# Patient Record
Sex: Male | Born: 1985 | Race: Black or African American | Hispanic: No | Marital: Single | Smoking: Current every day smoker
Health system: Southern US, Community
[De-identification: ages and names within clinical notes are randomized; demographics above are authoritative.]

## PROBLEM LIST (undated history)

## (undated) DIAGNOSIS — J45909 Unspecified asthma, uncomplicated: Secondary | ICD-10-CM

## (undated) DIAGNOSIS — M109 Gout, unspecified: Secondary | ICD-10-CM

## (undated) HISTORY — DX: Unspecified asthma, uncomplicated: J45.909

## (undated) HISTORY — PX: NO PAST SURGERIES: SHX2092

---

## 2019-03-27 ENCOUNTER — Other Ambulatory Visit: Payer: Self-pay

## 2019-03-27 ENCOUNTER — Emergency Department (HOSPITAL_COMMUNITY)
Admission: EM | Admit: 2019-03-27 | Discharge: 2019-03-27 | Disposition: A | Discharge status: Home / Self Care | Payer: Self-pay | Attending: Emergency Medicine | Admitting: Emergency Medicine

## 2019-03-27 ENCOUNTER — Emergency Department (HOSPITAL_COMMUNITY): Payer: Self-pay

## 2019-03-27 ENCOUNTER — Encounter (HOSPITAL_COMMUNITY): Payer: Self-pay

## 2019-03-27 DIAGNOSIS — W109XXA Fall (on) (from) unspecified stairs and steps, initial encounter: Secondary | ICD-10-CM | POA: Insufficient documentation

## 2019-03-27 DIAGNOSIS — Y999 Unspecified external cause status: Secondary | ICD-10-CM | POA: Insufficient documentation

## 2019-03-27 DIAGNOSIS — M25572 Pain in left ankle and joints of left foot: Secondary | ICD-10-CM | POA: Insufficient documentation

## 2019-03-27 DIAGNOSIS — Y9301 Activity, walking, marching and hiking: Secondary | ICD-10-CM | POA: Insufficient documentation

## 2019-03-27 DIAGNOSIS — Y929 Unspecified place or not applicable: Secondary | ICD-10-CM | POA: Insufficient documentation

## 2019-03-27 MED ORDER — ACETAMINOPHEN 325 MG PO TABS
650.0000 mg | ORAL_TABLET | Freq: Once | ORAL | Status: AC | PRN
  Administered 2019-03-27: 650 mg via ORAL
  Filled 2019-03-27: qty 2

## 2019-03-27 NOTE — Discharge Instructions (Addendum)
Tylenol ibuprofen as needed for pain.  Do not take more than 4000 mg Tylenol or more than 2400 mg ibuprofen daily.  Make sure to ice and rest your ankle.  Follow-up with orthopedics if you are unable to walk on your foot after 3 days.

## 2019-03-27 NOTE — ED Provider Notes (Signed)
[facility] EMERGENCY DEPARTMENT Provider Note   CSN: [id number] Arrival date & time: 03/27/19  1723     History   Chief Complaint Chief Complaint  Patient presents with  . Ankle Pain    HPI Grant Ramirez is a 33 y.o. male with patient if past medical history who presents for evaluation left ankle pain.  Patient states he was walking down the stairs when he tripped and inverted his left ankle.  Patient denies hitting head, LOC or anticoagulation.  Patient states he has had some swelling to his left lateral ankle in the incident.  He has been able to walk however states he has pain with walking.  Denies headache, neck pain, back pain, paresthesias, decreased range of motion, redness, swelling, warmth to his extremities.  Denies additional aggravating or alleviating factors.  Rates his current pain a 6/10.  This was improved with Tylenol given by triage nurse.  History obtained from patient and past medical records.  No interpreter is used.     HPI  History reviewed. No pertinent past medical history.  There are no active problems to display for this patient.   History reviewed. No pertinent surgical history.      Home Medications    Prior to Admission medications   Not on File    Family History No family history on file.  Social History Social History   Tobacco Use  . Smoking status: Not on file  Substance Use Topics  . Alcohol use: Not on file  . Drug use: Not on file     Allergies   Patient has no allergy information on record.   Review of Systems Review of Systems  Constitutional: Negative.   HENT: Negative.   Respiratory: Negative.   Cardiovascular: Negative.   Gastrointestinal: Negative.   Genitourinary: Negative.   Musculoskeletal: Positive for gait problem.       Left ankle pain. Limp with ambulation  Skin: Negative.   All other systems reviewed and are negative.    Physical Exam Updated Vital Signs BP (!) 143/88 (BP  Location: Left Arm)   Pulse 98   Temp 98.2 F (36.8 C) (Oral)   Resp 16   Ht 6\' 1"  (1.854 m)   SpO2 99%   Physical Exam Vitals signs and nursing note reviewed.  Constitutional:      General: He is not in acute distress.    Appearance: He is well-developed. He is not ill-appearing, toxic-appearing or diaphoretic.  HENT:     Head: Normocephalic and atraumatic.     Nose: Nose normal.     Mouth/Throat:     Mouth: Mucous membranes are moist.  Eyes:     Pupils: Pupils are equal, round, and reactive to light.  Neck:     Musculoskeletal: Normal range of motion and neck supple.  Cardiovascular:     Rate and Rhythm: Normal rate and regular rhythm.     Pulses: Normal pulses.          Dorsalis pedis pulses are 2+ on the right side and 2+ on the left side.       Posterior tibial pulses are 2+ on the right side and 2+ on the left side.     Heart sounds: Normal heart sounds.  Pulmonary:     Effort: Pulmonary effort is normal. No respiratory distress.  Abdominal:     General: Bowel sounds are normal. There is no distension.     Palpations: Abdomen is soft.  Musculoskeletal: Normal  range of motion.     Left knee: Normal.     Right ankle: Normal.     Left ankle: He exhibits swelling. He exhibits normal range of motion, no ecchymosis, no deformity, no laceration and normal pulse. Tenderness. Lateral malleolus tenderness found. No medial malleolus, no AITFL, no CF ligament, no posterior TFL, no head of 5th metatarsal and no proximal fibula tenderness found.     Left lower leg: Normal.     Right foot: Normal.     Left foot: Normal.       Feet:     Comments: Tenderness palpation to left lateral malleolus.  He has full range of motion without difficulty.  He has no bony tenderness to foot, midshaft or proximal tibia or fibula.  No tenderness to foot.  He is ambulatory with limp. Compartments soft. No tenderness to calf.  Feet:     Right foot:     Skin integrity: Skin integrity normal.     Left  foot:     Skin integrity: Skin integrity normal.  Skin:    General: Skin is warm and dry.     Capillary Refill: Capillary refill takes less than 2 seconds.     Comments: Mild soft tissue swelling to left lateral malleolus.  No ecchymosis, erythema, warmth.  Brisk capillary refill.  Neurological:     Mental Status: He is alert.     Comments: Intact sensation.  Ambulatory with limp to left ankle.    ED Treatments / Results  Labs (all labs ordered are listed, but only abnormal results are displayed) Labs Reviewed - No data to display  EKG None  Radiology Dg Ankle Complete Left  Result Date: 03/27/2019 CLINICAL DATA:  Fall with lateral swelling. EXAM: LEFT ANKLE COMPLETE - 3+ VIEW COMPARISON:  None. FINDINGS: Moderate lateral malleolar soft tissue swelling. Age advanced degenerative changes about the medial compartment of the knee. Osseous fragment distally is favored to be related to remote trauma or an accessory ossicle. Base of fifth metatarsal and talar dome intact. Small calcaneal spur. IMPRESSION: Lateral soft tissue swelling, without adjacent fracture. Age advanced degenerative changes with likely nonacute ossific fragment about the medial malleolus. Correlate with point tenderness. Electronically Signed   By: Jeronimo GreavesKyle  Talbot M.D.   On: 03/27/2019 18:04    Procedures Procedures (including critical care time)  Medications Ordered in ED Medications  acetaminophen (TYLENOL) tablet 650 mg (650 mg Oral Given 03/27/19 1732)     Initial Impression / Assessment and Plan / ED Course  I have reviewed the triage vital signs and the nursing notes.  Pertinent labs & imaging results that were available during my care of the patient were reviewed by me and considered in my medical decision making (see chart for details).  33 year old male appears otherwise well presents for evaluation of left ankle pain after mechanical fall.  He is neurovascularly intact.  Neuromusculoskeletal exam however  he does have some mild soft tissue swelling through his left lateral malleolus.  He is ambulatory with a limp.  Plain film x-ray without evidence of fracture, dislocation or effusion.  Likely MSK sprain or strain.  Will place in Aircast, crutches, rice for symptomatic management.  Patient to follow-up with orthopedics if symptoms unresolved.  I have low suspicion for septic joint, gout, hemarthrosis, acute fracture, dislocation, DVT, compartment syndrome, acute bacterial infectious process.  The patient has been appropriately medically screened and/or stabilized in the ED. I have low suspicion for any other emergent medical condition which  would require further screening, evaluation or treatment in the ED or require inpatient management.        Final Clinical Impressions(s) / ED Diagnoses   Final diagnoses:  Acute left ankle pain    ED Discharge Orders    None       Marky Buresh A, PA-C 03/27/19 2022    Isla Pence, MD 03/27/19 2122

## 2019-03-27 NOTE — ED Notes (Signed)
Patient Alert and oriented to baseline. Stable and ambulatory to baseline. Patient verbalized understanding of the discharge instructions.  Patient belongings were taken by the patient.   

## 2019-03-27 NOTE — ED Triage Notes (Signed)
Pt reports left ankle pain after tripping down the stairs. Pt denies LOC or hitting his head, no other injuries. Pt ambulatory

## 2019-04-22 ENCOUNTER — Ambulatory Visit
Admission: EM | Admit: 2019-04-22 | Discharge: 2019-04-22 | Disposition: A | Discharge status: Home / Self Care | Payer: Self-pay | Attending: Physician Assistant | Admitting: Physician Assistant

## 2019-04-22 ENCOUNTER — Other Ambulatory Visit: Payer: Self-pay

## 2019-04-22 ENCOUNTER — Encounter: Payer: Self-pay | Admitting: Emergency Medicine

## 2019-04-22 DIAGNOSIS — L0291 Cutaneous abscess, unspecified: Secondary | ICD-10-CM

## 2019-04-22 DIAGNOSIS — L02219 Cutaneous abscess of trunk, unspecified: Secondary | ICD-10-CM

## 2019-04-22 MED ORDER — MUPIROCIN 2 % EX OINT: 1.0000 "application " | TOPICAL_OINTMENT | Freq: Two times a day (BID) | CUTANEOUS | 0 refills | Status: DC

## 2019-04-22 MED ORDER — DOXYCYCLINE HYCLATE 100 MG PO CAPS: 100.0000 mg | ORAL_CAPSULE | Freq: Two times a day (BID) | ORAL | 0 refills | Status: DC

## 2019-04-22 NOTE — ED Triage Notes (Signed)
Cyst on right side. Drained a little a few days ago. Been present for 1 week.

## 2019-04-22 NOTE — Discharge Instructions (Addendum)
Start doxycycline as directed. You can remove current dressing in 24 hours. Keep wound clean and dry. You can clean gently with soap and water. Do not soak area in water. Monitor for spreading redness, increased warmth, increased swelling, fever, follow up for reevaluation needed. °

## 2019-04-22 NOTE — ED Provider Notes (Signed)
[name]    CSN: [id number] Arrival date & time: 04/22/19  1347      History   Chief Complaint Chief Complaint  Patient presents with  . Abscess    HPI Grant Ramirez is a 33 y.o. male.   33 year old male comes in for possible abscess to the right flank x 1 week. Denies injury/trauma. Has been doing warm compress with some relief. Yesterday, noticed significant increase in size with increased pain and therefore came in for evaluation. Denies fever, chills, body aches. Had mild drainage today.      History reviewed. No pertinent past medical history.  There are no problems to display for this patient.   History reviewed. No pertinent surgical history.     Home Medications    Prior to Admission medications   Medication Sig Start Date End Date Taking? Authorizing Provider  doxycycline (VIBRAMYCIN) 100 MG capsule Take 1 capsule (100 mg total) by mouth 2 (two) times daily. 04/22/19   Tasia Catchings, Amy V, PA-C  mupirocin ointment (BACTROBAN) 2 % Apply 1 application topically 2 (two) times daily. 04/22/19   Ok Edwards, PA-C    Family History History reviewed. No pertinent family history.  Social History Social History   Tobacco Use  . Smoking status: Current Every Day Smoker    Packs/day: 1.00    Types: Cigarettes  . Smokeless tobacco: Never Used  Substance Use Topics  . Alcohol use: Not on file  . Drug use: Not on file     Allergies   Patient has no known allergies.   Review of Systems Review of Systems  Reason unable to perform ROS: See HPI as above.     Physical Exam Triage Vital Signs ED Triage Vitals  Enc Vitals Group     BP 04/22/19 1358 (!) 162/102     Pulse Rate 04/22/19 1358 98     Resp 04/22/19 1358 16     Temp 04/22/19 1358 99 F (37.2 C)     Temp Source 04/22/19 1358 Oral     SpO2 04/22/19 1358 97 %     Weight --      Height --      Head Circumference --      Peak Flow --      Pain Score 04/22/19 1356 10     Pain Loc --       Pain Edu? --      Excl. in [place]? --    No data found.  Updated Vital Signs BP (!) 162/102   Pulse 98   Temp 99 F (37.2 C) (Oral)   Resp 16   SpO2 97%   Physical Exam Constitutional:      General: He is not in acute distress.    Appearance: He is well-developed. He is not diaphoretic.  HENT:     Head: Normocephalic and atraumatic.  Eyes:     Conjunctiva/sclera: Conjunctivae normal.     Pupils: Pupils are equal, round, and reactive to light.  Pulmonary:     Effort: Pulmonary effort is normal. No respiratory distress.     Comments: LCTAB Musculoskeletal:     Comments: Abscess to the right flank across skin fold, approx 3cm x 4cm with induration and cellulitis aprox 5cm x 5cm in size.  Skin:    General: Skin is warm and dry.  Neurological:     Mental Status: He is alert and oriented to person, place, and time.      UC  Treatments / Results  Labs (all labs ordered are listed, but only abnormal results are displayed) Labs Reviewed - No data to display  EKG   Radiology No results found.  Procedures Incision and Drainage  Date/Time: 04/22/2019 2:28 PM Performed by: Belinda Fisher, PA-C Authorized by: Belinda Fisher, PA-C   Consent:    Consent obtained:  Verbal   Consent given by:  Patient   Risks discussed:  Bleeding, incomplete drainage, pain, damage to other organs and infection   Alternatives discussed:  Alternative treatment Location:    Type:  Abscess   Size:  4cm x 3cm   Location:  Trunk   Trunk location:  Back Pre-procedure details:    Skin preparation:  Hibiclens Anesthesia (see MAR for exact dosages):    Anesthesia method:  Local infiltration   Local anesthetic:  Lidocaine 1% WITH epi Procedure type:    Complexity:  Simple Procedure details:    Needle aspiration: no     Incision types:  Stab incision   Incision depth:  Dermal   Scalpel blade:  11   Wound management:  Probed and deloculated   Drainage:  Purulent and bloody   Drainage amount:   Copious   Wound treatment:  Wound left open Post-procedure details:    Patient tolerance of procedure:  Tolerated well, no immediate complications   (including critical care time)  Medications Ordered in UC Medications - No data to display  Initial Impression / Assessment and Plan / UC Course  I have reviewed the triage vital signs and the nursing notes.  Pertinent labs & imaging results that were available during my care of the patient were reviewed by me and considered in my medical decision making (see chart for details).    Patient tolerated procedure well. Given location, recheck lung CLTAB. Start doxycycline for surrounding cellulitis. Wound care instructions given. Return precautions given. Patient expresses understanding and agrees to plan.   Final Clinical Impressions(s) / UC Diagnoses   Final diagnoses:  Abscess   ED Prescriptions    Medication Sig Dispense Auth. Provider   doxycycline (VIBRAMYCIN) 100 MG capsule Take 1 capsule (100 mg total) by mouth 2 (two) times daily. 14 capsule Yu, Amy V, PA-C   mupirocin ointment (BACTROBAN) 2 % Apply 1 application topically 2 (two) times daily. 22 g Belinda Fisher, PA-C     PDMP not reviewed this encounter.   Belinda Fisher, PA-C 04/22/19 1430

## 2019-05-19 ENCOUNTER — Encounter: Payer: Self-pay | Admitting: Emergency Medicine

## 2019-05-19 ENCOUNTER — Other Ambulatory Visit: Payer: Self-pay

## 2019-05-19 ENCOUNTER — Ambulatory Visit
Admission: EM | Admit: 2019-05-19 | Discharge: 2019-05-19 | Disposition: A | Discharge status: Home / Self Care | Payer: Self-pay

## 2019-05-19 DIAGNOSIS — M25561 Pain in right knee: Secondary | ICD-10-CM

## 2019-05-19 HISTORY — DX: Gout, unspecified: M10.9

## 2019-05-19 MED ORDER — MELOXICAM 7.5 MG PO TABS: 7.5000 mg | ORAL_TABLET | Freq: Every day | ORAL | 0 refills | Status: DC

## 2019-05-19 NOTE — ED Provider Notes (Signed)
[name]    CSN: [id number] Arrival date & time: 05/19/19  0936      History   Chief Complaint Chief Complaint  Patient presents with  . Knee Pain    HPI Grant Ramirez is a 34 y.o. male.   34 year old male comes in for 4 day history of right anterior knee pain. Denies injury/trauma. Denies pain at rest. Pain exacerbated by movement and weight bearing. States has felt that knee is more swollen, but denies erythema, warmth. Denies radiation of pain, numbness/tingling. Denies posterior knee/leg pain. He took two doses of colchicine.       Past Medical History:  Diagnosis Date  . Gout     There are no problems to display for this patient.   History reviewed. No pertinent surgical history.     Home Medications    Prior to Admission medications   Medication Sig Start Date End Date Taking? Authorizing Provider  colchicine 0.6 MG tablet Take 0.6 mg by mouth daily.   Yes [provider]  meloxicam (MOBIC) 7.5 MG tablet Take 1 tablet (7.5 mg total) by mouth daily. 05/19/19   Cathie Hoops, Liana Camerer V, PA-C  mupirocin ointment (BACTROBAN) 2 % Apply 1 application topically 2 (two) times daily. 04/22/19   Belinda Fisher, PA-C    Family History Family History  Problem Relation Age of Onset  . Diabetes Father     Social History Social History   Tobacco Use  . Smoking status: Current Every Day Smoker    Packs/day: 1.00    Types: Cigarettes  . Smokeless tobacco: Never Used  Substance Use Topics  . Alcohol use: Not Currently    Comment: rarely  . Drug use: Never     Allergies   Patient has no known allergies.   Review of Systems Review of Systems  Reason unable to perform ROS: See HPI as above.     Physical Exam Triage Vital Signs ED Triage Vitals  Enc Vitals Group     BP 05/19/19 0945 133/68     Pulse Rate 05/19/19 0945 68     Resp 05/19/19 0945 16     Temp 05/19/19 0945 98 F (36.7 C)     Temp Source 05/19/19 0945 Oral     SpO2 05/19/19 0945 99  %     Weight --      Height --      Head Circumference --      Peak Flow --      Pain Score 05/19/19 0947 10     Pain Loc --      Pain Edu? --      Excl. in [place]? --    No data found.  Updated Vital Signs BP 133/68 (BP Location: Left Arm)   Pulse 68   Temp 98 F (36.7 C) (Oral)   Resp 16   SpO2 99%   Physical Exam Constitutional:      General: He is not in acute distress.    Appearance: He is well-developed. He is not diaphoretic.  HENT:     Head: Normocephalic and atraumatic.  Eyes:     Conjunctiva/sclera: Conjunctivae normal.     Pupils: Pupils are equal, round, and reactive to light.  Pulmonary:     Effort: Pulmonary effort is normal. No respiratory distress.  Musculoskeletal:     Comments: No swelling, erythema, warmth. No tenderness to palpation of the knee. Full ROM of the knee, with pain to the superior and inferior  of the patellar during ROM. No pain during passive ROM. Strength normal and equal bilaterally. Sensation intact and equal bilaterally.   No pain or swelling to the posterior knee and calf.   Skin:    General: Skin is warm and dry.  Neurological:     Mental Status: He is alert and oriented to person, place, and time.    UC Treatments / Results  Labs (all labs ordered are listed, but only abnormal results are displayed) Labs Reviewed - No data to display  EKG   Radiology No results found.  Procedures Procedures (including critical care time)  Medications Ordered in UC Medications - No data to display  Initial Impression / Assessment and Plan / UC Course  I have reviewed the triage vital signs and the nursing notes.  Pertinent labs & imaging results that were available during my care of the patient were reviewed by me and considered in my medical decision making (see chart for details).    Nontraumatic knee pain. Low suspicion for gout. Will discontinue gout and will start NSAIDs. Knee sleeve, ice compress, rest. Return precautions given.   Final Clinical Impressions(s) / UC Diagnoses   Final diagnoses:  Acute pain of right knee   ED Prescriptions    Medication Sig Dispense Auth. Provider   meloxicam (MOBIC) 7.5 MG tablet Take 1 tablet (7.5 mg total) by mouth daily. 15 tablet Ok Edwards, PA-C     PDMP not reviewed this encounter.   Ok Edwards, PA-C 05/19/19 1801

## 2019-05-19 NOTE — ED Triage Notes (Addendum)
Pt presents to [facility] for assessment of right anterior knee pain x 4 days.  Denies known injury.  States he had a fall approx 1 month ago, but his left ankle was mostly impacted.  Hx of gout, took 2 doses so far of colchicine.

## 2019-05-19 NOTE — Discharge Instructions (Signed)
Start Mobic. Do not take ibuprofen (motrin/advil)/ naproxen (aleve) while on mobic. Ice compress, elevation, knee sleeve during activity. Decrease strenuous activity for now. If symptoms not improving after 1 week, follow up with orthopedics for further evaluation and management needed.

## 2019-12-13 ENCOUNTER — Other Ambulatory Visit: Payer: Self-pay

## 2019-12-13 ENCOUNTER — Ambulatory Visit
Admission: EM | Admit: 2019-12-13 | Discharge: 2019-12-13 | Disposition: A | Discharge status: Home / Self Care | Payer: Managed Care, Other (non HMO)

## 2019-12-13 ENCOUNTER — Encounter: Payer: Self-pay | Admitting: Emergency Medicine

## 2019-12-13 DIAGNOSIS — M25532 Pain in left wrist: Secondary | ICD-10-CM

## 2019-12-13 MED ORDER — PREDNISONE 10 MG (21) PO TBPK: ORAL_TABLET | Freq: Every day | ORAL | 0 refills | Status: DC

## 2019-12-13 NOTE — Discharge Instructions (Addendum)
RICE: rest, ice, compression, elevation as needed for pain.   Cold therapy (ice packs) can be used to help swelling both after injury and after prolonged use of areas of chronic pain/aches.  Pain medication:  Prednisone as directed with breakfast: 6-5-4-3-2-1  Important to follow up with specialist(s) below for further evaluation/management if your symptoms persist or worsen.

## 2019-12-13 NOTE — ED Triage Notes (Addendum)
Pt presents to [facility] for assessment of left wrist pain since waking up this morning.  Denies known injury.  Abrasion noted to forearm.  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  Hx of Gout, mostly to foot, but has had it in his wrist before.  States he has not been drinking liquor which usually causes his flare ups.
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
## 2019-12-13 NOTE — ED Provider Notes (Signed)
[name]    CSN: [id number] Arrival date & time: 12/13/19  1343      History   Chief Complaint Chief Complaint  Patient presents with  . Wrist Pain    HPI Grant Ramirez is a 34 y.o. male with history of gout presenting for left wrist pain and swelling since waking this morning.  Denies injury, numbness, deformity.  States this feels like gout, though has not been increasing his alcohol intake which typically causes his flareups.  Does use his left hand predominantly at work: Driving, lifting.  Cannot remember specific inciting event.  No warmth, redness, rash, fever, charges, myalgias.  Tried aleve w/o relief.   Past Medical History:  Diagnosis Date  . Gout     There are no problems to display for this patient.   History reviewed. No pertinent surgical history.     Home Medications    Prior to Admission medications   Medication Sig Start Date End Date Taking? Authorizing Provider  naproxen sodium (ALEVE) 220 MG tablet Take 220 mg by mouth daily as needed.   Yes [provider]  predniSONE (STERAPRED UNI-PAK 21 TAB) 10 MG (21) TBPK tablet Take by mouth daily. Take steroid taper as written 12/13/19   Hall-Potvin, [place], PA-C  colchicine 0.6 MG tablet Take 0.6 mg by mouth daily.  12/13/19  [provider]    Family History Family History  Problem Relation Age of Onset  . Diabetes Father     Social History Social History   Tobacco Use  . Smoking status: Current Every Day Smoker    Packs/day: 0.33    Types: Cigarettes  . Smokeless tobacco: Never Used  Substance Use Topics  . Alcohol use: Not Currently    Comment: rarely  . Drug use: Never     Allergies   Patient has no known allergies.   Review of Systems As per HPI   Physical Exam Triage Vital Signs ED Triage Vitals [12/13/19 1356]  Enc Vitals Group     BP 134/81     Pulse Rate 80     Resp 18     Temp 98.5 F (36.9 C)     Temp Source Oral     SpO2 95 %      Weight      Height      Head Circumference      Peak Flow      Pain Score 8     Pain Loc      Pain Edu?      Excl. in [place]?    No data found.  Updated Vital Signs BP 134/81 (BP Location: Right Arm)   Pulse 80   Temp 98.5 F (36.9 C) (Oral)   Resp 18   SpO2 95%   Visual Acuity Right Eye Distance:   Left Eye Distance:   Bilateral Distance:    Right Eye Near:   Left Eye Near:    Bilateral Near:     Physical Exam Constitutional:      General: He is not in acute distress. HENT:     Head: Normocephalic and atraumatic.  Eyes:     General: No scleral icterus.    Pupils: Pupils are equal, round, and reactive to light.  Cardiovascular:     Rate and Rhythm: Normal rate.  Pulmonary:     Effort: Pulmonary effort is normal. No respiratory distress.     Breath sounds: No wheezing.  Musculoskeletal:  General: Swelling and tenderness present.     Comments: Left wrist with diffuse, mild swelling as compared to right.  Neurovascularly intact.  Decreased grip strength and flexion/extension second to pain and swelling.  Negative Tinel's, negative Phalen's, negative Finkelstein's test.  Skin:    Capillary Refill: Capillary refill takes less than 2 seconds.     Coloration: Skin is not jaundiced or pale.     Findings: No erythema.  Neurological:     General: No focal deficit present.     Mental Status: He is alert and oriented to person, place, and time.      UC Treatments / Results  Labs (all labs ordered are listed, but only abnormal results are displayed) Labs Reviewed - No data to display  EKG   Radiology No results found.  Procedures Procedures (including critical care time)  Medications Ordered in UC Medications - No data to display  Initial Impression / Assessment and Plan / UC Course  I have reviewed the triage vital signs and the nursing notes.  Pertinent labs & imaging results that were available during my care of the patient were reviewed by me and  considered in my medical decision making (see chart for details).     Patient appears well in office and is without inciting event: Radiography deferred.  Given patient's history of gout this could be contributory, though tendinitis is also possible.  Ace wrap applied in office, will trial prednisone, and follow-up with PCP for further evaluation/management if needed.  Return precautions discussed, pt verbalized understanding and is agreeable to plan. Final Clinical Impressions(s) / UC Diagnoses   Final diagnoses:  Left wrist pain     Discharge Instructions     RICE: rest, ice, compression, elevation as needed for pain.   Cold therapy (ice packs) can be used to help swelling both after injury and after prolonged use of areas of chronic pain/aches.  Pain medication:  Prednisone as directed with breakfast: 6-5-4-3-2-1  Important to follow up with specialist(s) below for further evaluation/management if your symptoms persist or worsen.    ED Prescriptions    Medication Sig Dispense Auth. Provider   predniSONE (STERAPRED UNI-PAK 21 TAB) 10 MG (21) TBPK tablet Take by mouth daily. Take steroid taper as written 21 tablet Hall-Potvin, [place], PA-C     PDMP not reviewed this encounter.   Hall-Potvin, [place], [place] 12/13/19 1423

## 2020-05-12 ENCOUNTER — Other Ambulatory Visit: Payer: Managed Care, Other (non HMO)

## 2020-05-12 DIAGNOSIS — Z20822 Contact with and (suspected) exposure to covid-19: Secondary | ICD-10-CM | POA: Diagnosis not present

## 2020-05-13 LAB — SARS-COV-2, NAA 2 DAY TAT

## 2020-05-13 LAB — NOVEL CORONAVIRUS, NAA: SARS-CoV-2, NAA: DETECTED — AB

## 2020-05-28 ENCOUNTER — Other Ambulatory Visit: Payer: Self-pay

## 2020-05-28 ENCOUNTER — Encounter: Payer: Self-pay | Admitting: Emergency Medicine

## 2020-05-28 ENCOUNTER — Ambulatory Visit
Admission: EM | Admit: 2020-05-28 | Discharge: 2020-05-28 | Disposition: A | Discharge status: Home / Self Care | Payer: BC Managed Care – PPO | Attending: Emergency Medicine | Admitting: Emergency Medicine

## 2020-05-28 DIAGNOSIS — L02212 Cutaneous abscess of back [any part, except buttock]: Secondary | ICD-10-CM | POA: Diagnosis not present

## 2020-05-28 MED ORDER — IBUPROFEN 800 MG PO TABS: 800.0000 mg | ORAL_TABLET | Freq: Three times a day (TID) | ORAL | 0 refills | Status: DC

## 2020-05-28 MED ORDER — DOXYCYCLINE HYCLATE 100 MG PO CAPS: 100.0000 mg | ORAL_CAPSULE | Freq: Two times a day (BID) | ORAL | 0 refills | Status: AC

## 2020-05-28 NOTE — Discharge Instructions (Addendum)
Begin doxycycline twice daily for the next week Warm compresses Tylenol and ibuprofen for pain and swelling Follow-up if not improving or worsening, developing increased swelling or redness to area

## 2020-05-28 NOTE — ED Triage Notes (Signed)
Pt here with possible abscess to right side noticed 4 days ago; denies drainage

## 2020-05-29 NOTE — ED Provider Notes (Signed)
[name]    CSN: [id number] Arrival date & time: 05/28/20  1741      History   Chief Complaint Chief Complaint  Patient presents with  . Abscess    HPI Grant Ramirez is a 35 y.o. male presenting today for evaluation of possible abscess.  Reports abscess to his right side which began to increase in size and pain approximately 2-3 days ago.  Denies any drainage.  Reports history of similar in the past.  Denies fevers.  Reports symptoms began after he started smoking cigarettes again.  HPI  Past Medical History:  Diagnosis Date  . Gout     There are no problems to display for this patient.   History reviewed. No pertinent surgical history.     Home Medications    Prior to Admission medications   Medication Sig Start Date End Date Taking? Authorizing Provider  doxycycline (VIBRAMYCIN) 100 MG capsule Take 1 capsule (100 mg total) by mouth 2 (two) times daily for 7 days. 05/28/20 06/04/20 Yes Vanna Shavers C, PA-C  ibuprofen (ADVIL) 800 MG tablet Take 1 tablet (800 mg total) by mouth 3 (three) times daily. 05/28/20  Yes Dontravious Camille C, PA-C  naproxen sodium (ALEVE) 220 MG tablet Take 220 mg by mouth daily as needed.    [provider]  predniSONE (STERAPRED UNI-PAK 21 TAB) 10 MG (21) TBPK tablet Take by mouth daily. Take steroid taper as written Patient not taking: Reported on 05/28/2020 12/13/19   Hall-Potvin, [place], PA-C  colchicine 0.6 MG tablet Take 0.6 mg by mouth daily.  12/13/19  [provider]    Family History Family History  Problem Relation Age of Onset  . Diabetes Father     Social History Social History   Tobacco Use  . Smoking status: Current Every Day Smoker    Packs/day: 0.33    Types: Cigarettes  . Smokeless tobacco: Never Used  Substance Use Topics  . Alcohol use: Not Currently    Comment: rarely  . Drug use: Never     Allergies   Patient has no known allergies.   Review of Systems Review of Systems   Constitutional: Negative for fatigue and fever.  Eyes: Negative for redness, itching and visual disturbance.  Respiratory: Negative for shortness of breath.   Cardiovascular: Negative for chest pain and leg swelling.  Gastrointestinal: Negative for nausea and vomiting.  Musculoskeletal: Negative for arthralgias and myalgias.  Skin: Positive for color change. Negative for rash and wound.  Neurological: Negative for dizziness, syncope, weakness, light-headedness and headaches.     Physical Exam Triage Vital Signs ED Triage Vitals  Enc Vitals Group     BP 05/28/20 1921 (!) 151/106     Pulse Rate 05/28/20 1921 94     Resp 05/28/20 1921 18     Temp 05/28/20 1921 (!) 97.5 F (36.4 C)     Temp Source 05/28/20 1921 Oral     SpO2 05/28/20 1921 96 %     Weight --      Height --      Head Circumference --      Peak Flow --      Pain Score 05/28/20 1922 5     Pain Loc --      Pain Edu? --      Excl. in GC? --    No data found.  Updated Vital Signs BP (!) 151/106 (BP Location: Right Arm)   Pulse 94   Temp (!) 97.5 F (  36.4 C) (Oral)   Resp 18   SpO2 96%   Visual Acuity Right Eye Distance:   Left Eye Distance:   Bilateral Distance:    Right Eye Near:   Left Eye Near:    Bilateral Near:     Physical Exam Vitals and nursing note reviewed.  Constitutional:      Appearance: He is well-developed and well-nourished.     Comments: No acute distress  HENT:     Head: Normocephalic and atraumatic.     Nose: Nose normal.  Eyes:     Conjunctiva/sclera: Conjunctivae normal.  Cardiovascular:     Rate and Rhythm: Normal rate.  Pulmonary:     Effort: Pulmonary effort is normal. No respiratory distress.  Abdominal:     General: There is no distension.  Musculoskeletal:        General: Normal range of motion.     Cervical back: Neck supple.  Skin:    General: Skin is warm and dry.     Comments: Right upper flank with area of mild induration and mild erythema with central  depression and/black scabbing, no fluctuance noted, nontender to palpation  Neurological:     Mental Status: He is alert and oriented to person, place, and time.  Psychiatric:        Mood and Affect: Mood and affect normal.      UC Treatments / Results  Labs (all labs ordered are listed, but only abnormal results are displayed) Labs Reviewed - No data to display  EKG   Radiology No results found.  Procedures Procedures (including critical care time)  Medications Ordered in UC Medications - No data to display  Initial Impression / Assessment and Plan / UC Course  I have reviewed the triage vital signs and the nursing notes.  Pertinent labs & imaging results that were available during my care of the patient were reviewed by me and considered in my medical decision making (see chart for details).     Appears to have likely inflamed/infected cyst versus abscess to right flank, does not seem to warrant I&D at this time, initiating on doxycycline and recommending warm compresses and close monitoring.  Patient to follow-up in 2 to 3 days if not improving or worsening for follow-up I&D if needed.  Discussed strict return precautions. Patient verbalized understanding and is agreeable with plan.  Final Clinical Impressions(s) / UC Diagnoses   Final diagnoses:  Back abscess     Discharge Instructions     Begin doxycycline twice daily for the next week Warm compresses Tylenol and ibuprofen for pain and swelling Follow-up if not improving or worsening, developing increased swelling or redness to area   ED Prescriptions    Medication Sig Dispense Auth. Provider   doxycycline (VIBRAMYCIN) 100 MG capsule Take 1 capsule (100 mg total) by mouth 2 (two) times daily for 7 days. 14 capsule Illyria Sobocinski C, PA-C   ibuprofen (ADVIL) 800 MG tablet Take 1 tablet (800 mg total) by mouth 3 (three) times daily. 21 tablet Willford Rabideau, [place] C, PA-C     PDMP not reviewed this encounter.    Lew Dawes, PA-C 05/29/20 1016

## 2020-05-30 ENCOUNTER — Other Ambulatory Visit: Payer: Self-pay

## 2020-05-30 ENCOUNTER — Ambulatory Visit
Admission: EM | Admit: 2020-05-30 | Discharge: 2020-05-30 | Disposition: A | Discharge status: Home / Self Care | Payer: BC Managed Care – PPO

## 2020-05-30 DIAGNOSIS — L02212 Cutaneous abscess of back [any part, except buttock]: Secondary | ICD-10-CM | POA: Diagnosis not present

## 2020-05-30 NOTE — ED Triage Notes (Signed)
Pt is here with an abscess that started 6 days ago, pt is back cause of persistent pain. Pt is back for a follow up after being seen 2 days ago.

## 2020-05-30 NOTE — ED Provider Notes (Signed)
[name]    CSN: [id number] Arrival date & time: 05/30/20  1726      History   Chief Complaint Chief Complaint  Patient presents with  . Abscess    HPI Grant Ramirez is a 35 y.o. male presenting today for follow-up of abscess.  Has had abscess to his right side which began to swell up earlier this week.  Was seen here 2 days ago and started on doxycycline.  At time mainly induration, no fluctuance,.  Following up symptoms have not improved.  HPI  Past Medical History:  Diagnosis Date  . Gout     There are no problems to display for this patient.   History reviewed. No pertinent surgical history.     Home Medications    Prior to Admission medications   Medication Sig Start Date End Date Taking? Authorizing Provider  doxycycline (VIBRAMYCIN) 100 MG capsule Take 1 capsule (100 mg total) by mouth 2 (two) times daily for 7 days. 05/28/20 06/04/20  Crispin Vogel C, PA-C  ibuprofen (ADVIL) 800 MG tablet Take 1 tablet (800 mg total) by mouth 3 (three) times daily. 05/28/20   Jud Fanguy C, PA-C  naproxen sodium (ALEVE) 220 MG tablet Take 220 mg by mouth daily as needed.    [provider]  colchicine 0.6 MG tablet Take 0.6 mg by mouth daily.  12/13/19  [provider]    Family History Family History  Problem Relation Age of Onset  . Diabetes Father     Social History Social History   Tobacco Use  . Smoking status: Current Every Day Smoker    Packs/day: 0.33    Types: Cigarettes  . Smokeless tobacco: Never Used  Substance Use Topics  . Alcohol use: Yes  . Drug use: Never     Allergies   Shellfish allergy   Review of Systems Review of Systems  Constitutional: Negative for fatigue and fever.  Eyes: Negative for redness, itching and visual disturbance.  Respiratory: Negative for shortness of breath.   Cardiovascular: Negative for chest pain and leg swelling.  Gastrointestinal: Negative for nausea and vomiting.   Musculoskeletal: Negative for arthralgias and myalgias.  Skin: Positive for color change. Negative for rash and wound.  Neurological: Negative for dizziness, syncope, weakness, light-headedness and headaches.     Physical Exam Triage Vital Signs ED Triage Vitals  Enc Vitals Group     BP      Pulse      Resp      Temp      Temp src      SpO2      Weight      Height      Head Circumference      Peak Flow      Pain Score      Pain Loc      Pain Edu?      Excl. in GC?    No data found.  Updated Vital Signs BP (!) 156/89 (BP Location: Left Arm)   Pulse 79   Temp 98.3 F (36.8 C) (Oral)   Resp 19   SpO2 97%   Visual Acuity Right Eye Distance:   Left Eye Distance:   Bilateral Distance:    Right Eye Near:   Left Eye Near:    Bilateral Near:     Physical Exam Vitals and nursing note reviewed.  Constitutional:      Appearance: He is well-developed and well-nourished.     Comments: No acute  distress  HENT:     Head: Normocephalic and atraumatic.     Nose: Nose normal.  Eyes:     Conjunctiva/sclera: Conjunctivae normal.  Cardiovascular:     Rate and Rhythm: Normal rate.  Pulmonary:     Effort: Pulmonary effort is normal. No respiratory distress.  Abdominal:     General: There is no distension.  Musculoskeletal:        General: Normal range of motion.     Cervical back: Neck supple.  Skin:    General: Skin is warm and dry.  Neurological:     Mental Status: He is alert and oriented to person, place, and time.  Psychiatric:        Mood and Affect: Mood and affect normal.      UC Treatments / Results  Labs (all labs ordered are listed, but only abnormal results are displayed) Labs Reviewed - No data to display  EKG   Radiology No results found.  Procedures Procedures (including critical care time)  Medications Ordered in UC Medications - No data to display  Initial Impression / Assessment and Plan / UC Course  I have reviewed the triage  vital signs and the nursing notes.  Pertinent labs & imaging results that were available during my care of the patient were reviewed by me and considered in my medical decision making (see chart for details).     Abscess actively draining spontaneously, expressed additional copious drainage out of wound, probed with hemostat slightly without any additional drainage obtained.  Continue on Doxy, warm compresses and monitor for continued gradual resolution.  Discussed strict return precautions. Patient verbalized understanding and is agreeable with plan.  Final Clinical Impressions(s) / UC Diagnoses   Final diagnoses:  Abscess of back   Discharge Instructions   None    ED Prescriptions    None     PDMP not reviewed this encounter.   Lew Dawes, [place] 05/30/20 [phone]

## 2020-06-03 DIAGNOSIS — M79671 Pain in right foot: Secondary | ICD-10-CM | POA: Diagnosis not present

## 2020-06-03 DIAGNOSIS — L600 Ingrowing nail: Secondary | ICD-10-CM | POA: Diagnosis not present

## 2020-06-03 DIAGNOSIS — M79672 Pain in left foot: Secondary | ICD-10-CM | POA: Diagnosis not present

## 2020-06-03 DIAGNOSIS — B351 Tinea unguium: Secondary | ICD-10-CM | POA: Diagnosis not present

## 2020-06-03 NOTE — Progress Notes (Signed)
Patient did not show for appointment.   

## 2020-06-04 ENCOUNTER — Encounter: Payer: Managed Care, Other (non HMO) | Admitting: Family

## 2020-06-04 DIAGNOSIS — R059 Cough, unspecified: Secondary | ICD-10-CM

## 2020-06-13 ENCOUNTER — Other Ambulatory Visit: Payer: Self-pay

## 2020-06-13 ENCOUNTER — Ambulatory Visit (INDEPENDENT_AMBULATORY_CARE_PROVIDER_SITE_OTHER): Payer: Self-pay | Admitting: Family

## 2020-06-13 ENCOUNTER — Encounter: Payer: Self-pay | Admitting: Family

## 2020-06-13 VITALS — BP 129/86 | HR 102 | Ht 73.58 in | Wt 370.6 lb

## 2020-06-13 DIAGNOSIS — Z7689 Persons encountering health services in other specified circumstances: Secondary | ICD-10-CM

## 2020-06-13 NOTE — Progress Notes (Signed)
    Subjective:    Grant Ramirez - 35 y.o. male MRN [id number]  Date of birth: [birth year]  HPI  Grant Ramirez is to establish care. Patient has no significant PMH.    Current issues and/or concerns: Reports he does not have any concerns for today. Reports he recently had Covid on 05/12/2020. States he passed out at home because of it. When he came to he said he felt fine and did not seek medical attention at that time. Today feeling well.    ROS per HPI   Health Maintenance:  Health Maintenance Due  Topic Date Due  . Hepatitis C Screening  Never done  . COVID-19 Vaccine (1) Never done  . HIV Screening  Never done  . TETANUS/TDAP  Never done   Past Medical History: There are no problems to display for this patient.   Social History   reports that he has been smoking cigarettes. He has been smoking about 0.25 packs per day. He has never used smokeless tobacco. He reports current alcohol use of about 1.0 standard drink of alcohol per week. He reports that he does not use drugs.   Family History  family history includes Diabetes in his father and paternal grandmother.   Medications: reviewed and updated   Objective:   Physical Exam BP 129/86 (BP Location: Left Arm, Patient Position: Sitting)   Pulse (!) 102   Ht 6' 1.58" (1.869 m)   Wt (!) 370 lb 9.6 oz (168.1 kg)   SpO2 98%   BMI 48.12 kg/m     Physical Exam Constitutional:      Appearance: He is obese.  HENT:     Head: Normocephalic.  Eyes:     Extraocular Movements: Extraocular movements intact.     Pupils: Pupils are equal, round, and reactive to light.  Cardiovascular:     Rate and Rhythm: Tachycardia present.     Pulses: Normal pulses.     Heart sounds: Normal heart sounds.  Pulmonary:     Effort: Pulmonary effort is normal.     Breath sounds: Normal breath sounds.  Musculoskeletal:     Cervical back: Normal range of motion and neck supple.  Neurological:     General: No focal deficit present.      Mental Status: He is alert.  Psychiatric:        Mood and Affect: Mood normal.        Behavior: Behavior normal.       Assessment & Plan:  1. Encounter to establish care: - Patient presents today to establish care.  - Return for annual physical examination, labs, and health maintenance. Arrive fasting meaning having had no food and/or nothing to drink for at least 8 hours prior to appointment.   Ricky Stabs, NP 06/16/2020, 9:44 PM Primary Care at [facility]

## 2020-06-13 NOTE — Patient Instructions (Signed)
Return for annual physical examination, labs, and health maintenance. Arrive fasting meaning having had no food and/or nothing to drink for at least 8 hours prior to appointment.  Thank you for choosing Primary Care at [facility] for your medical home!    Grant Ramirez was seen by Grant Fendt, NP today.   Grant Ramirez's primary care provider is Grant Fendt, NP.   For the best care possible,  you should try to see Grant Stabs, NP whenever you come to clinic.   We look forward to seeing you again soon!  If you have any questions about your visit today,  please call us at [phone]  Or feel free to reach your provider via MyChart.    DASH Eating Plan DASH stands for Dietary Approaches to Stop Hypertension. The DASH eating plan is a healthy eating plan that has been shown to:  Reduce high blood pressure (hypertension).  Reduce your risk for type 2 diabetes, heart disease, and stroke.  Help with weight loss. What are tips for following this plan? Reading food labels  Check food labels for the amount of salt (sodium) per serving. Choose foods with less than 5 percent of the Daily Value of sodium. Generally, foods with less than 300 milligrams (mg) of sodium per serving fit into this eating plan.  To find whole grains, look for the word "whole" as the first word in the ingredient list. Shopping  Buy products labeled as "low-sodium" or "no salt added."  Buy fresh foods. Avoid canned foods and pre-made or frozen meals. Cooking  Avoid adding salt when cooking. Use salt-free seasonings or herbs instead of table salt or sea salt. Check with your health care provider or pharmacist before using salt substitutes.  Do not fry foods. Cook foods using healthy methods such as baking, boiling, grilling, roasting, and broiling instead.  Cook with heart-healthy oils, such as olive, canola, avocado, soybean, or sunflower oil. Meal planning  Eat a balanced diet that  includes: ? 4 or more servings of fruits and 4 or more servings of vegetables each day. Try to fill one-half of your plate with fruits and vegetables. ? 6-8 servings of whole grains each day. ? Less than 6 oz (170 g) of lean meat, poultry, or fish each day. A 3-oz (85-g) serving of meat is about the same size as a deck of cards. One egg equals 1 oz (28 g). ? 2-3 servings of low-fat dairy each day. One serving is 1 cup (237 mL). ? 1 serving of nuts, seeds, or beans 5 times each week. ? 2-3 servings of heart-healthy fats. Healthy fats called omega-3 fatty acids are found in foods such as walnuts, flaxseeds, fortified milks, and eggs. These fats are also found in cold-water fish, such as sardines, salmon, and mackerel.  Limit how much you eat of: ? Canned or prepackaged foods. ? Food that is high in trans fat, such as some fried foods. ? Food that is high in saturated fat, such as fatty meat. ? Desserts and other sweets, sugary drinks, and other foods with added sugar. ? Full-fat dairy products.  Do not salt foods before eating.  Do not eat more than 4 egg yolks a week.  Try to eat at least 2 vegetarian meals a week.  Eat more home-cooked food and less restaurant, buffet, and fast food.   Lifestyle  When eating at a restaurant, ask that your food be prepared with less salt or no salt, if possible.  If you drink alcohol: ? Limit how much you use to:  0-1 drink a day for women who are not pregnant.  0-2 drinks a day for men. ? Be aware of how much alcohol is in your drink. In the [place] one drink equals one 12 oz bottle of beer (355 mL), one 5 oz glass of wine (148 mL), or one 1 oz glass of hard liquor (44 mL). General information  Avoid eating more than 2,300 mg of salt a day. If you have hypertension, you may need to reduce your sodium intake to 1,500 mg a day.  Work with your health care provider to maintain a healthy body weight or to lose weight. Ask what an ideal weight is for  you.  Get at least 30 minutes of exercise that causes your heart to beat faster (aerobic exercise) most days of the week. Activities may include walking, swimming, or biking.  Work with your health care provider or dietitian to adjust your eating plan to your individual calorie needs. What foods should I eat? Fruits All fresh, dried, or frozen fruit. Canned fruit in natural juice (without added sugar). Vegetables Fresh or frozen vegetables (raw, steamed, roasted, or grilled). Low-sodium or reduced-sodium tomato and vegetable juice. Low-sodium or reduced-sodium tomato sauce and tomato paste. Low-sodium or reduced-sodium canned vegetables. Grains Whole-grain or whole-wheat bread. Whole-grain or whole-wheat pasta. Brown rice. Grant Ramirez. Bulgur. Whole-grain and low-sodium cereals. Pita bread. Low-fat, low-sodium crackers. Whole-wheat flour tortillas. Meats and other proteins Skinless chicken or [place]. Ground chicken or [place]. Pork with fat trimmed off. Fish and seafood. Egg whites. Dried beans, peas, or lentils. Unsalted nuts, nut butters, and seeds. Unsalted canned beans. Lean cuts of beef with fat trimmed off. Low-sodium, lean precooked or cured meat, such as sausages or meat loaves. Dairy Low-fat (1%) or fat-free (skim) milk. Reduced-fat, low-fat, or fat-free cheeses. Nonfat, low-sodium ricotta or cottage cheese. Low-fat or nonfat yogurt. Low-fat, low-sodium cheese. Fats and oils Soft margarine without trans fats. Vegetable oil. Reduced-fat, low-fat, or light mayonnaise and salad dressings (reduced-sodium). Canola, safflower, olive, avocado, soybean, and sunflower oils. Avocado. Seasonings and condiments Herbs. Spices. Seasoning mixes without salt. Other foods Unsalted popcorn and pretzels. Fat-free sweets. The items listed above may not be a complete list of foods and beverages you can eat. Contact a dietitian for more information. What foods should I avoid? Fruits Canned fruit in a  light or heavy syrup. Fried fruit. Fruit in cream or butter sauce. Vegetables Creamed or fried vegetables. Vegetables in a cheese sauce. Regular canned vegetables (not low-sodium or reduced-sodium). Regular canned tomato sauce and paste (not low-sodium or reduced-sodium). Regular tomato and vegetable juice (not low-sodium or reduced-sodium). Angie Fava. Olives. Grains Baked goods made with fat, such as croissants, muffins, or some breads. Dry pasta or rice meal packs. Meats and other proteins Fatty cuts of meat. Ribs. Fried meat. Berniece Salines. Bologna, salami, and other precooked or cured meats, such as sausages or meat loaves. Fat from the back of a pig (fatback). Bratwurst. Salted nuts and seeds. Canned beans with added salt. Canned or smoked fish. Whole eggs or egg yolks. Chicken or [place] with skin. Dairy Whole or 2% milk, cream, and half-and-half. Whole or full-fat cream cheese. Whole-fat or sweetened yogurt. Full-fat cheese. Nondairy creamers. Whipped toppings. Processed cheese and cheese spreads. Fats and oils Butter. Stick margarine. Lard. Shortening. Ghee. Bacon fat. Tropical oils, such as coconut, palm kernel, or palm oil. Seasonings and condiments Onion salt, garlic salt, seasoned salt, table salt, and  sea salt. Worcestershire sauce. Tartar sauce. Barbecue sauce. Teriyaki sauce. Soy sauce, including reduced-sodium. Steak sauce. Canned and packaged gravies. Fish sauce. Oyster sauce. Cocktail sauce. Store-bought horseradish. Ketchup. Mustard. Meat flavorings and tenderizers. Bouillon cubes. Hot sauces. Pre-made or packaged marinades. Pre-made or packaged taco seasonings. Relishes. Regular salad dressings. Other foods Salted popcorn and pretzels. The items listed above may not be a complete list of foods and beverages you should avoid. Contact a dietitian for more information. Where to find more information  National Heart, Lung, and Blood Institute: https://wilson-eaton.com/  American Heart Association:  www.heart.org  Academy of Nutrition and Dietetics: www.eatright.[place]: www.kidney.org Summary  The DASH eating plan is a healthy eating plan that has been shown to reduce high blood pressure (hypertension). It may also reduce your risk for type 2 diabetes, heart disease, and stroke.  When on the DASH eating plan, aim to eat more fresh fruits and vegetables, whole grains, lean proteins, low-fat dairy, and heart-healthy fats.  With the DASH eating plan, you should limit salt (sodium) intake to 2,300 mg a day. If you have hypertension, you may need to reduce your sodium intake to 1,500 mg a day.  Work with your health care provider or dietitian to adjust your eating plan to your individual calorie needs. This information is not intended to replace advice given to you by your health care provider. Make sure you discuss any questions you have with your health care provider. Document Revised: 03/30/2019 Document Reviewed: 03/30/2019 Elsevier Patient Education  2021 [employer].

## 2020-06-13 NOTE — Progress Notes (Signed)
Establish care No concerns Needs physuical

## 2020-07-15 ENCOUNTER — Encounter: Payer: Managed Care, Other (non HMO) | Admitting: Family

## 2020-07-18 ENCOUNTER — Encounter: Payer: Managed Care, Other (non HMO) | Admitting: Family

## 2020-10-08 ENCOUNTER — Encounter: Payer: Self-pay | Admitting: Emergency Medicine

## 2020-10-08 ENCOUNTER — Other Ambulatory Visit: Payer: Self-pay

## 2020-10-08 ENCOUNTER — Ambulatory Visit
Admission: EM | Admit: 2020-10-08 | Discharge: 2020-10-08 | Disposition: A | Discharge status: Home / Self Care | Payer: BC Managed Care – PPO | Attending: Emergency Medicine | Admitting: Emergency Medicine

## 2020-10-08 DIAGNOSIS — L02412 Cutaneous abscess of left axilla: Secondary | ICD-10-CM

## 2020-10-08 MED ORDER — IBUPROFEN 800 MG PO TABS: 800.0000 mg | ORAL_TABLET | Freq: Three times a day (TID) | ORAL | 0 refills | Status: DC

## 2020-10-08 MED ORDER — DOXYCYCLINE HYCLATE 100 MG PO CAPS: 100.0000 mg | ORAL_CAPSULE | Freq: Two times a day (BID) | ORAL | 0 refills | Status: AC

## 2020-10-08 NOTE — ED Triage Notes (Signed)
Pt here for possible abscess to left axillary area x 3 days with pain; denies drainage

## 2020-10-08 NOTE — ED Provider Notes (Signed)
[name]    CSN: [id number] Arrival date & time: 10/08/20  1739      History   Chief Complaint Chief Complaint  Patient presents with  . Abscess    HPI Grant Ramirez is a 35 y.o. male history of asthma presenting today for evaluation of left axillary abscess.  Reports symptoms began to develop pain to area approximately 5 days ago.  Denies drainage.  Has history of abscesses to this area, but has not had any problems over the past 10 years.  Denies fevers.  HPI  Past Medical History:  Diagnosis Date  . Asthma    Phreesia 06/10/2020  . Gout     There are no problems to display for this patient.   Past Surgical History:  Procedure Laterality Date  . NO PAST SURGERIES         Home Medications    Prior to Admission medications   Medication Sig Start Date End Date Taking? Authorizing Provider  doxycycline (VIBRAMYCIN) 100 MG capsule Take 1 capsule (100 mg total) by mouth 2 (two) times daily for 10 days. 10/08/20 10/18/20 Yes Keyaira Clapham C, PA-C  ibuprofen (ADVIL) 800 MG tablet Take 1 tablet (800 mg total) by mouth 3 (three) times daily. 10/08/20  Yes Soraiya Ahner C, PA-C  terbinafine (LAMISIL) 250 MG tablet Take 250 mg by mouth daily. 06/03/20   [provider]  colchicine 0.6 MG tablet Take 0.6 mg by mouth daily.  12/13/19  [provider]    Family History Family History  Problem Relation Age of Onset  . Diabetes Father   . Diabetes Paternal Grandmother     Social History Social History   Tobacco Use  . Smoking status: Current Every Day Smoker    Packs/day: 0.25    Types: Cigarettes  . Smokeless tobacco: Never Used  Vaping Use  . Vaping Use: Never used  Substance Use Topics  . Alcohol use: Yes    Alcohol/week: 1.0 standard drink    Types: 1 Shots of liquor per week    Comment: occassionally  . Drug use: Never     Allergies   Shellfish allergy   Review of Systems Review of Systems  Constitutional: Negative  for fatigue and fever.  Eyes: Negative for redness, itching and visual disturbance.  Respiratory: Negative for shortness of breath.   Cardiovascular: Negative for chest pain and leg swelling.  Gastrointestinal: Negative for nausea and vomiting.  Musculoskeletal: Negative for arthralgias and myalgias.  Skin: Positive for color change. Negative for rash and wound.  Neurological: Negative for dizziness, syncope, weakness, light-headedness and headaches.     Physical Exam Triage Vital Signs ED Triage Vitals  Enc Vitals Group     BP 10/08/20 1851 128/69     Pulse Rate 10/08/20 1851 98     Resp 10/08/20 1851 18     Temp 10/08/20 1851 98.6 F (37 C)     Temp Source 10/08/20 1851 Oral     SpO2 10/08/20 1851 97 %     Weight --      Height --      Head Circumference --      Peak Flow --      Pain Score 10/08/20 1854 7     Pain Loc --      Pain Edu? --      Excl. in GC? --    No data found.  Updated Vital Signs BP 128/69 (BP Location: Right Arm)   Pulse 98  Temp 98.6 F (37 C) (Oral)   Resp 18   SpO2 97%   Visual Acuity Right Eye Distance:   Left Eye Distance:   Bilateral Distance:    Right Eye Near:   Left Eye Near:    Bilateral Near:     Physical Exam Vitals and nursing note reviewed.  Constitutional:      Appearance: He is well-developed.     Comments: No acute distress  HENT:     Head: Normocephalic and atraumatic.     Nose: Nose normal.  Eyes:     Conjunctiva/sclera: Conjunctivae normal.  Cardiovascular:     Rate and Rhythm: Normal rate.  Pulmonary:     Effort: Pulmonary effort is normal. No respiratory distress.  Abdominal:     General: There is no distension.  Musculoskeletal:        General: Normal range of motion.     Cervical back: Neck supple.  Skin:    General: Skin is warm and dry.     Comments: Left axilla with large area of swelling, induration and fluctuance  Neurological:     Mental Status: He is alert and oriented to person, place, and  time.      UC Treatments / Results  Labs (all labs ordered are listed, but only abnormal results are displayed) Labs Reviewed - No data to display  EKG   Radiology No results found.  Procedures Incision and Drainage  Date/Time: 10/08/2020 7:55 PM Performed by: Otilio Groleau, Tazlina C, PA-C Authorized by: Rafay Dahan, Junius Creamer, PA-C   Consent:    Consent obtained:  Verbal   Consent given by:  Patient   Risks, benefits, and alternatives were discussed: yes     Risks discussed:  Pain   Alternatives discussed:  No treatment Universal protocol:    Patient identity confirmed:  Verbally with patient Location:    Type:  Abscess   Location: Left axilla. Pre-procedure details:    Skin preparation:  Povidone-iodine Anesthesia:    Anesthesia method:  Local infiltration   Local anesthetic:  Lidocaine 2% WITH epi Procedure type:    Complexity:  Simple Procedure details:    Incision types:  Single straight   Incision depth:  Subcutaneous   Wound management:  Probed and deloculated   Drainage:  Bloody and purulent   Drainage amount:  Copious   Packing materials:  1/4 in iodoform gauze Post-procedure details:    Procedure completion:  Tolerated well, no immediate complications   (including critical care time)  Medications Ordered in UC Medications - No data to display  Initial Impression / Assessment and Plan / UC Course  I have reviewed the triage vital signs and the nursing notes.  Pertinent labs & imaging results that were available during my care of the patient were reviewed by me and considered in my medical decision making (see chart for details).     Left axilla abscess-I&D performed, placing on doxycycline, packing removal in 48 hours, warm compresses and monitoring for gradual resolution  Discussed strict return precautions. Patient verbalized understanding and is agreeable with plan.  Final Clinical Impressions(s) / UC Diagnoses   Final diagnoses:  Abscess of left  axilla     Discharge Instructions     Please begin doxycycline for 10 days  Packing removal in 48 hours  Apply warm compresses/hot rags to area with massage to express further drainage especially the first 24-48 hours  Return if symptoms returning or not improving    ED Prescriptions  Medication Sig Dispense Auth. Provider   doxycycline (VIBRAMYCIN) 100 MG capsule Take 1 capsule (100 mg total) by mouth 2 (two) times daily for 10 days. 20 capsule Lavaeh Bau C, PA-C   ibuprofen (ADVIL) 800 MG tablet Take 1 tablet (800 mg total) by mouth 3 (three) times daily. 21 tablet Marjorie Lussier, [place] C, PA-C     PDMP not reviewed this encounter.   Lew Dawes, [place] 10/08/20 1956

## 2020-10-08 NOTE — Discharge Instructions (Signed)
Please begin doxycycline for 10 days  Packing removal in 48 hours  Apply warm compresses/hot rags to area with massage to express further drainage especially the first 24-48 hours  Return if symptoms returning or not improving

## 2020-10-21 ENCOUNTER — Emergency Department (HOSPITAL_COMMUNITY)
Admission: EM | Admit: 2020-10-21 | Discharge: 2020-10-21 | Disposition: A | Discharge status: Home / Self Care | Payer: BC Managed Care – PPO | Attending: Emergency Medicine | Admitting: Emergency Medicine

## 2020-10-21 ENCOUNTER — Ambulatory Visit
Admission: EM | Admit: 2020-10-21 | Discharge: 2020-10-21 | Disposition: A | Discharge status: Home / Self Care | Payer: BC Managed Care – PPO

## 2020-10-21 ENCOUNTER — Other Ambulatory Visit: Payer: Self-pay

## 2020-10-21 ENCOUNTER — Encounter (HOSPITAL_COMMUNITY): Payer: Self-pay

## 2020-10-21 DIAGNOSIS — L089 Local infection of the skin and subcutaneous tissue, unspecified: Secondary | ICD-10-CM | POA: Diagnosis not present

## 2020-10-21 DIAGNOSIS — J45909 Unspecified asthma, uncomplicated: Secondary | ICD-10-CM | POA: Diagnosis not present

## 2020-10-21 DIAGNOSIS — R221 Localized swelling, mass and lump, neck: Secondary | ICD-10-CM | POA: Diagnosis not present

## 2020-10-21 DIAGNOSIS — M542 Cervicalgia: Secondary | ICD-10-CM | POA: Diagnosis not present

## 2020-10-21 DIAGNOSIS — F17210 Nicotine dependence, cigarettes, uncomplicated: Secondary | ICD-10-CM | POA: Insufficient documentation

## 2020-10-21 DIAGNOSIS — B432 Subcutaneous pheomycotic abscess and cyst: Secondary | ICD-10-CM | POA: Diagnosis not present

## 2020-10-21 DIAGNOSIS — L723 Sebaceous cyst: Secondary | ICD-10-CM

## 2020-10-21 MED ORDER — DOXYCYCLINE HYCLATE 100 MG PO CAPS: 100.0000 mg | ORAL_CAPSULE | Freq: Two times a day (BID) | ORAL | 0 refills | Status: DC

## 2020-10-21 NOTE — Discharge Instructions (Addendum)
Take doxycycline as prescribed and complete the full course. Recommended bathing once weekly with pHisoDerm soap, this is available in the pharmacy or through online ordering.  Hopefully this will help cut down on recurrent skin infections. Recommend follow-up with general surgery to discuss removal of the cyst from your neck.

## 2020-10-21 NOTE — Discharge Instructions (Addendum)
Follow-up with your primary care provider you will need an ultrasound to identify what type of cyst or mass has developed underneath the skin of your neck.  You can request an online: Clinic visit or schedule in person visit with your primary care provider Grant Ramirez.

## 2020-10-21 NOTE — ED Triage Notes (Signed)
Pt c/o a hard raised area to back of neck x 1.5 wks. States had an abscess under his arm a few wks ago and this feels similar.

## 2020-10-21 NOTE — ED Triage Notes (Signed)
Pt c/o midline posterior neck pain x 1.5 wks. Sent over from UC. Hard area felt on back of neck, denies fevers or drainage

## 2020-10-21 NOTE — ED Provider Notes (Signed)
[name]    CSN: [id number] Arrival date & time: 10/21/20  1436      History   Chief Complaint No chief complaint on file.   HPI Grant Ramirez is a 35 y.o. male.   HPI Patient presents today for evaluation of a neck cyst.  He reports noting a hardened area on the back of his neck a little over a week ago.  He is uncertain of the duration of time but is actually been present.  He he reports it is hard and different from prior abscess lesions that he has had in the past.  Is nonpainful just uncomfortable.  Past Medical History:  Diagnosis Date   Asthma    Phreesia 06/10/2020   Gout     There are no problems to display for this patient.   Past Surgical History:  Procedure Laterality Date   NO PAST SURGERIES         Home Medications    Prior to Admission medications   Medication Sig Start Date End Date Taking? Authorizing Provider  ibuprofen (ADVIL) 800 MG tablet Take 1 tablet (800 mg total) by mouth 3 (three) times daily. 10/08/20   Wieters, Hallie C, PA-C  terbinafine (LAMISIL) 250 MG tablet Take 250 mg by mouth daily. 06/03/20   [provider]  colchicine 0.6 MG tablet Take 0.6 mg by mouth daily.  12/13/19  [provider]    Family History Family History  Problem Relation Age of Onset   Diabetes Father    Diabetes Paternal Grandmother     Social History Social History   Tobacco Use   Smoking status: Every Day    Packs/day: 0.25    Pack years: 0.00    Types: Cigarettes   Smokeless tobacco: Never  Vaping Use   Vaping Use: Never used  Substance Use Topics   Alcohol use: Yes    Alcohol/week: 1.0 standard drink    Types: 1 Shots of liquor per week    Comment: occassionally   Drug use: Never     Allergies   Shellfish allergy   Review of Systems Review of Systems Pertinent negatives listed in HPI  Physical Exam Triage Vital Signs ED Triage Vitals  Enc Vitals Group     BP      Pulse      Resp      Temp       Temp src      SpO2      Weight      Height      Head Circumference      Peak Flow      Pain Score      Pain Loc      Pain Edu?      Excl. in GC?    No data found.  Updated Vital Signs There were no vitals taken for this visit.  Visual Acuity Right Eye Distance:   Left Eye Distance:   Bilateral Distance:    Right Eye Near:   Left Eye Near:    Bilateral Near:     Physical Exam HENT:     Head:      Comments: Palpable harden, non-mobile mass like structure palpated subdermis of the skin  Cardiovascular:     Rate and Rhythm: Normal rate.  Pulmonary:     Effort: Pulmonary effort is normal.     Breath sounds: Normal breath sounds.  Neurological:     General: No focal deficit present.  Mental Status: He is oriented to person, place, and time.     GCS: GCS eye subscore is 4. GCS verbal subscore is 5. GCS motor subscore is 6.  Psychiatric:        Attention and Perception: Attention normal.        Mood and Affect: Mood normal.        Speech: Speech normal.     UC Treatments / Results  Labs (all labs ordered are listed, but only abnormal results are displayed) Labs Reviewed - No data to display  EKG   Radiology No results found.  Procedures Procedures (including critical care time)  Medications Ordered in UC Medications - No data to display  Initial Impression / Assessment and Plan / UC Course  I have reviewed the triage vital signs and the nursing notes.  Pertinent labs & imaging results that were available during my care of the patient were reviewed by me and considered in my medical decision making (see chart for details).    Patient has a palpable mass on the posterior region of his neck.  Unknown etiology of sources.  It is nonmobile nontender.  Recommend follow-up evaluation with primary care provider as he at minimal needs to start with a head neck ultrasound to determine the source of the mass.  Patient is established with Primary Care at [facility],  advised to schedule a telemedicine or in person visit with primary care provider for further work-up and evaluation of neck mass. Final Clinical Impressions(s) / UC Diagnoses   Final diagnoses:  Neck mass     Discharge Instructions      Follow-up with your primary care provider you will need an ultrasound to identify what type of cyst or mass has developed underneath the skin of your neck.  You can request an online: Clinic visit or schedule in person visit with your primary care provider Amy Zonia Kief.    ED Prescriptions   None    PDMP not reviewed this encounter.   Bing Neighbors, FNP 10/21/20 1742

## 2020-10-21 NOTE — ED Provider Notes (Signed)
[facility]   CSN: [id number] Arrival date & time: 10/21/20  1937     History Chief Complaint  Patient presents with   Neck Pain    Grant Ramirez is a 35 y.o. male.  35 year old male presents with complaint of pain to his posterior neck, went to urgent care today and was sent to the ER for ultrasound of the area      Past Medical History:  Diagnosis Date   Asthma    Phreesia 06/10/2020   Gout     There are no problems to display for this patient.   Past Surgical History:  Procedure Laterality Date   NO PAST SURGERIES         Family History  Problem Relation Age of Onset   Diabetes Father    Diabetes Paternal Grandmother     Social History   Tobacco Use   Smoking status: Every Day    Packs/day: 0.25    Pack years: 0.00    Types: Cigarettes   Smokeless tobacco: Never  Vaping Use   Vaping Use: Never used  Substance Use Topics   Alcohol use: Yes    Alcohol/week: 1.0 standard drink    Types: 1 Shots of liquor per week    Comment: occassionally   Drug use: Never    Home Medications Prior to Admission medications   Medication Sig Start Date End Date Taking? Authorizing Provider  doxycycline (VIBRAMYCIN) 100 MG capsule Take 1 capsule (100 mg total) by mouth 2 (two) times daily. 10/21/20  Yes Jeannie Fend, PA-C  ibuprofen (ADVIL) 800 MG tablet Take 1 tablet (800 mg total) by mouth 3 (three) times daily. 10/08/20   Wieters, Hallie C, PA-C  terbinafine (LAMISIL) 250 MG tablet Take 250 mg by mouth daily. 06/03/20   [provider]  colchicine 0.6 MG tablet Take 0.6 mg by mouth daily.  12/13/19  [provider]    Allergies    Shellfish allergy  Review of Systems   Review of Systems  Constitutional:  Negative for chills, diaphoresis and fever.  Musculoskeletal:  Negative for arthralgias and myalgias.  Skin:  Positive for wound. Negative for rash.  Allergic/Immunologic: Negative for  immunocompromised state.  Neurological:  Negative for weakness, numbness and headaches.  Hematological:  Negative for adenopathy.  Psychiatric/Behavioral:  Negative for confusion.   All other systems reviewed and are negative.  Physical Exam Updated Vital Signs BP (!) 155/84 (BP Location: Left Arm)   Pulse 93   Temp 98 F (36.7 C) (Oral)   Resp 16   Ht 6\' 1"  (1.854 m)   Wt (!) 170.1 kg   SpO2 98%   BMI 49.48 kg/m   Physical Exam Vitals and nursing note reviewed.  Constitutional:      General: He is not in acute distress.    Appearance: He is well-developed. He is not diaphoretic.  HENT:     Head: Normocephalic and atraumatic.  Pulmonary:     Effort: Pulmonary effort is normal.  Musculoskeletal:     Cervical back: Neck supple.  Lymphadenopathy:     Cervical: No cervical adenopathy.  Skin:    General: Skin is warm and dry.     Findings: No erythema or rash.       Neurological:     Mental Status: He is alert and oriented to person, place, and time.     Motor: No weakness.  Psychiatric:        Behavior:  Behavior normal.    ED Results / Procedures / Treatments   Labs (all labs ordered are listed, but only abnormal results are displayed) Labs Reviewed - No data to display  EKG None  Radiology No results found.  Procedures Procedures   Medications Ordered in ED Medications - No data to display  ED Course  I have reviewed the triage vital signs and the nursing notes.  Pertinent labs & imaging results that were available during my care of the patient were reviewed by me and considered in my medical decision making (see chart for details).  Clinical Course as of 10/21/20 2030  Tue Oct 21, 2020  2058 35 year old male with likely sebaceous cyst to posterior neck without overlying infection.  Referred to general surgery for removal.  Given doxycycline for area of drainage with erythema to right flank.  Recommend use of pHisoDerm soap weekly due to report of  recurrent body/skin infections. [LM]    Clinical Course User Index [LM] Alden Hipp   MDM Rules/Calculators/A&P                           Final Clinical Impression(s) / ED Diagnoses Final diagnoses:  Sebaceous cyst  Recurrent infection of skin    Rx / DC Orders ED Discharge Orders          Ordered    doxycycline (VIBRAMYCIN) 100 MG capsule  2 times daily        10/21/20 2021             Jeannie Fend, PA-C 10/21/20 2031    Lorre Nick, MD 10/24/20 1521

## 2020-10-22 ENCOUNTER — Telehealth: Payer: Self-pay | Admitting: Nurse Practitioner

## 2020-10-22 NOTE — Telephone Encounter (Signed)
VM was left Pt was called for a ED folllow up

## 2020-11-28 ENCOUNTER — Encounter: Payer: Self-pay | Admitting: Family

## 2020-11-28 ENCOUNTER — Ambulatory Visit (INDEPENDENT_AMBULATORY_CARE_PROVIDER_SITE_OTHER): Payer: Self-pay | Admitting: Family

## 2020-11-28 ENCOUNTER — Other Ambulatory Visit: Payer: Self-pay

## 2020-11-28 VITALS — BP 134/81 | HR 76 | Temp 98.1°F | Resp 15 | Ht 73.58 in | Wt 363.0 lb

## 2020-11-28 DIAGNOSIS — M7989 Other specified soft tissue disorders: Secondary | ICD-10-CM

## 2020-11-28 NOTE — Progress Notes (Signed)
Pt presents for mass on back of neck, no pain has been there for approx 1 month states that bumps as this one recurs in axilla area never on back of neck

## 2020-11-28 NOTE — Progress Notes (Signed)
Grant Ramirez, is a 35 y.o. male  WCB:[id number]  OHY:[id number]  DOB - June 11, 1985  Subjective:  Chief Complaint and HPI: Grant Ramirez is a 35 y.o. male here today t with complaints of a mass to the back of his neck.  Patient states that he visited local urgent care as well as emergency room, and was told that this was a pimple, was referred back to his primary care provider.  Denies any personal or family history of skin cancer, has not done physical exam for the past 4 years.  Denies chest pain, shortness of breath, headache, pain, or any other symptom.  The mass to his neck does not cause pain unless he is laying to his back.   ED/Hospital notes reviewed.   Social History: Reviewed Family history: Reviewed  ROS:   Constitutional:  No f/c, No night sweats, No unexplained weight loss.  Obese. EENT:  No vision changes, No blurry vision, No hearing changes. No mouth, throat, or ear problems.  Neck: Complains about a mass to the back of his neck for the past month and a half. Respiratory: No cough, No SOB Cardiac: No CP, no palpitations Musculoskeletal: No joint pain Neuro: No headache, no dizziness, no motor weakness.  Psych: Denies SI/HI  No problems updated.  ALLERGIES: Allergies  Allergen Reactions   Shellfish Allergy Anaphylaxis    PAST MEDICAL HISTORY: Past Medical History:  Diagnosis Date   Asthma    Phreesia 06/10/2020   Gout     MEDICATIONS AT HOME: Prior to Admission medications   Medication Sig Start Date End Date Taking? Authorizing Provider  doxycycline (VIBRAMYCIN) 100 MG capsule Take 1 capsule (100 mg total) by mouth 2 (two) times daily. 10/21/20   Jeannie Fend, PA-C  ibuprofen (ADVIL) 800 MG tablet Take 1 tablet (800 mg total) by mouth 3 (three) times daily. 10/08/20   Wieters, Hallie C, PA-C  terbinafine (LAMISIL) 250 MG tablet Take 250 mg by mouth daily. 06/03/20   [provider]  colchicine 0.6 MG tablet Take 0.6 mg by mouth daily.  12/13/19   [provider]     Objective:  EXAM:   Vitals:   11/28/20 1609  BP: 134/81  Pulse: 76  Resp: 15  Temp: 98.1 F (36.7 C)  SpO2: 98%  Weight: (!) 363 lb (164.7 kg)  Height: 6' 1.58" (1.869 m)    General appearance : A&OX3. NAD. Non-toxic-appearing HEENT: Atraumatic and Normocephalic.  PERRLA. EOM intact.  TM clear B. Mouth-MMM, post pharynx WNL w/o erythema, No PND. Neck: supple, no JVD. No cervical lymphadenopathy. No thyromegaly.  There is a mass bigger than a quarter size to the back of his neck on the right side, he moves with pain, no sign of infection and skin is intact. Chest/Lungs:  Breathing-non-labored, Good air entry bilaterally, breath sounds normal without rales, rhonchi, or wheezing  CVS: S1 S2 regular, no murmurs, gallops, rubs  Neurology:  CN II-XII grossly intact, Non focal.   Psych:  TP linear. J/I WNL. Normal speech. Appropriate eye contact and affect.    Data Review No results found for: HGBA1C   Assessment & Plan   1. Mass of soft tissue of neck -Report new or worsening symptoms to the clinic or local ER. -Take Tylenol or ibuprofen as needed for pain to the neck. -Ultrasound to the neck tissue is ordered. - US Soft Tissue Head/Neck (NON-THYROID)   Patient have been counseled extensively about nutrition and exercise  No follow-ups on file.  The patient was given clear instructions to go to ER or return to medical center if symptoms don't improve, worsen or new problems develop. The patient verbalized understanding. The patient was told to call to get lab results if they haven't heard anything in the next week.     Eleonore Chiquito, APRN, FNP-C [facility] and [facility] [place], [place] [phone]   11/28/2020, 4:32 PM

## 2020-12-04 ENCOUNTER — Ambulatory Visit: Payer: BC Managed Care – PPO | Admitting: Family

## 2020-12-05 ENCOUNTER — Ambulatory Visit (HOSPITAL_COMMUNITY): Payer: Self-pay

## 2020-12-08 ENCOUNTER — Other Ambulatory Visit: Payer: Self-pay

## 2020-12-08 ENCOUNTER — Ambulatory Visit (HOSPITAL_COMMUNITY)
Admission: RE | Admit: 2020-12-08 | Discharge: 2020-12-08 | Disposition: A | Discharge status: Home / Self Care | Payer: BC Managed Care – PPO | Source: Ambulatory Visit | Attending: Family | Admitting: Family

## 2020-12-08 DIAGNOSIS — R221 Localized swelling, mass and lump, neck: Secondary | ICD-10-CM | POA: Diagnosis not present

## 2020-12-08 DIAGNOSIS — M7989 Other specified soft tissue disorders: Secondary | ICD-10-CM | POA: Insufficient documentation

## 2020-12-08 DIAGNOSIS — R188 Other ascites: Secondary | ICD-10-CM | POA: Diagnosis not present

## 2020-12-11 ENCOUNTER — Other Ambulatory Visit: Payer: Self-pay | Admitting: Family

## 2020-12-11 DIAGNOSIS — M7989 Other specified soft tissue disorders: Secondary | ICD-10-CM

## 2020-12-12 ENCOUNTER — Telehealth: Payer: Self-pay | Admitting: Family

## 2020-12-12 NOTE — Telephone Encounter (Signed)
Can you please review result and respond? Thanks

## 2020-12-12 NOTE — Telephone Encounter (Signed)
Patient called regarding his [place] results . Please, call him thank you

## 2020-12-15 ENCOUNTER — Telehealth: Payer: Self-pay | Admitting: Family

## 2020-12-15 NOTE — Telephone Encounter (Signed)
Patient called wanting results from 12/08/2020.

## 2020-12-15 NOTE — Telephone Encounter (Signed)
Patient name and DOB verified. Patient aware of results and result note per Provider Ethelene Hal, FNP  Given phone number to the General Surgeon office. States she has not received a call.

## 2021-01-06 NOTE — Progress Notes (Deleted)
    Patient ID: Grant Ramirez, male    DOB: 05/06/1986  MRN: [id number]  CC: Annual Physical Exam   Subjective: Grant Ramirez is a 35 y.o. male who presents for annual physical exam.   His concerns today include: ***  NEED TDAP   There are no problems to display for this patient.    Current Outpatient Medications on File Prior to Visit  Medication Sig Dispense Refill   doxycycline (VIBRAMYCIN) 100 MG capsule Take 1 capsule (100 mg total) by mouth 2 (two) times daily. 20 capsule 0   ibuprofen (ADVIL) 800 MG tablet Take 1 tablet (800 mg total) by mouth 3 (three) times daily. 21 tablet 0   terbinafine (LAMISIL) 250 MG tablet Take 250 mg by mouth daily.     [DISCONTINUED] colchicine 0.6 MG tablet Take 0.6 mg by mouth daily.     No current facility-administered medications on file prior to visit.    Allergies  Allergen Reactions   Shellfish Allergy Anaphylaxis    Social History   Socioeconomic History   Marital status: Single    Spouse name: Not on file   Number of children: Not on file   Years of education: Not on file   Highest education level: Not on file  Occupational History   Not on file  Tobacco Use   Smoking status: Every Day    Packs/day: 0.25    Types: Cigarettes   Smokeless tobacco: Never  Vaping Use   Vaping Use: Never used  Substance and Sexual Activity   Alcohol use: Yes    Alcohol/week: 1.0 standard drink    Types: 1 Shots of liquor per week    Comment: occassionally   Drug use: Never   Sexual activity: Yes  Other Topics Concern   Not on file  Social History Narrative   Not on file   Social Determinants of Health   Financial Resource Strain: Not on file  Food Insecurity: Not on file  Transportation Needs: Not on file  Physical Activity: Not on file  Stress: Not on file  Social Connections: Not on file  Intimate Partner Violence: Not on file    Family History  Problem Relation Age of Onset   Diabetes Father    Diabetes Paternal  Grandmother     Past Surgical History:  Procedure Laterality Date   NO PAST SURGERIES      ROS: Review of Systems Negative except as stated above  PHYSICAL EXAM: There were no vitals taken for this visit.  Physical Exam  {male adult master:310786} {male adult master:310785}  No flowsheet data found. Lipid Panel  No results found for: CHOL, TRIG, HDL, CHOLHDL, VLDL, LDLCALC, LDLDIRECT  CBC No results found for: WBC, RBC, HGB, HCT, PLT, MCV, MCH, MCHC, RDW, LYMPHSABS, MONOABS, EOSABS, BASOSABS  ASSESSMENT AND PLAN:  There are no diagnoses linked to this encounter.   Patient was given the opportunity to ask questions.  Patient verbalized understanding of the plan and was able to repeat key elements of the plan. Patient was given clear instructions to go to Emergency Department or return to medical center if symptoms don't improve, worsen, or new problems develop.The patient verbalized understanding.   No orders of the defined types were placed in this encounter.    Requested Prescriptions    No prescriptions requested or ordered in this encounter    No follow-ups on file.  Rema Fendt, NP

## 2021-01-07 DIAGNOSIS — L723 Sebaceous cyst: Secondary | ICD-10-CM | POA: Diagnosis not present

## 2021-01-08 ENCOUNTER — Encounter: Payer: BC Managed Care – PPO | Admitting: Family

## 2021-01-08 DIAGNOSIS — Z0000 Encounter for general adult medical examination without abnormal findings: Secondary | ICD-10-CM

## 2021-01-08 DIAGNOSIS — Z114 Encounter for screening for human immunodeficiency virus [HIV]: Secondary | ICD-10-CM

## 2021-01-08 DIAGNOSIS — Z13228 Encounter for screening for other metabolic disorders: Secondary | ICD-10-CM

## 2021-01-08 DIAGNOSIS — Z131 Encounter for screening for diabetes mellitus: Secondary | ICD-10-CM

## 2021-01-08 DIAGNOSIS — Z1159 Encounter for screening for other viral diseases: Secondary | ICD-10-CM

## 2021-01-08 DIAGNOSIS — Z130 Encounter for screening for diseases of the blood and blood-forming organs and certain disorders involving the immune mechanism: Secondary | ICD-10-CM

## 2021-01-08 DIAGNOSIS — Z1329 Encounter for screening for other suspected endocrine disorder: Secondary | ICD-10-CM

## 2021-01-08 DIAGNOSIS — Z13220 Encounter for screening for lipoid disorders: Secondary | ICD-10-CM

## 2021-02-23 ENCOUNTER — Encounter: Payer: Self-pay | Admitting: Emergency Medicine

## 2021-02-23 ENCOUNTER — Other Ambulatory Visit: Payer: Self-pay

## 2021-02-23 ENCOUNTER — Ambulatory Visit
Admission: EM | Admit: 2021-02-23 | Discharge: 2021-02-23 | Disposition: A | Discharge status: Home / Self Care | Payer: BC Managed Care – PPO | Attending: Internal Medicine | Admitting: Internal Medicine

## 2021-02-23 DIAGNOSIS — L02212 Cutaneous abscess of back [any part, except buttock]: Secondary | ICD-10-CM | POA: Diagnosis not present

## 2021-02-23 MED ORDER — DOXYCYCLINE HYCLATE 100 MG PO CAPS: 100.0000 mg | ORAL_CAPSULE | Freq: Two times a day (BID) | ORAL | 0 refills | Status: DC

## 2021-02-23 NOTE — ED Provider Notes (Signed)
[name]    CSN: [id number] Arrival date & time: 02/23/21  1140      History   Chief Complaint Chief Complaint  Patient presents with   Abscess    HPI Grant Ramirez is a 35 y.o. male.   Patient presents with abscess to right back/side area that has been present for multiple weeks.  Patient reports that it has been draining on its own.  Denies any known fevers.  Denies pain.  Patient reports that the size of abscess has significantly decreased since yesterday.  Patient has history of multiple abscesses throughout body.  Last took antibiotic for abscess in June 2022.  Has surgery scheduled next month for sebaceous cyst in the neck.  Patient reports that he discussed this particular abscess with general surgeon when he went to be evaluated for sebaceous cyst, but no treatment was provided for this abscess at this time per patient.    Abscess  Past Medical History:  Diagnosis Date   Asthma    [employer] 06/10/2020   Gout     There are no problems to display for this patient.   Past Surgical History:  Procedure Laterality Date   NO PAST SURGERIES         Home Medications    Prior to Admission medications   Medication Sig Start Date End Date Taking? Authorizing Provider  doxycycline (VIBRAMYCIN) 100 MG capsule Take 1 capsule (100 mg total) by mouth 2 (two) times daily. 02/23/21  Yes Lance Muss, FNP  ibuprofen (ADVIL) 800 MG tablet Take 1 tablet (800 mg total) by mouth 3 (three) times daily. 10/08/20   Wieters, Hallie C, PA-C  terbinafine (LAMISIL) 250 MG tablet Take 250 mg by mouth daily. 06/03/20   [provider]  colchicine 0.6 MG tablet Take 0.6 mg by mouth daily.  12/13/19  [provider]    Family History Family History  Problem Relation Age of Onset   Diabetes Father    Diabetes Paternal Grandmother     Social History Social History   Tobacco Use   Smoking status: Every Day    Packs/day: 0.25    Types: Cigarettes    Smokeless tobacco: Never  Vaping Use   Vaping Use: Never used  Substance Use Topics   Alcohol use: Yes    Alcohol/week: 1.0 standard drink    Types: 1 Shots of liquor per week    Comment: occassionally   Drug use: Never     Allergies   Shellfish allergy   Review of Systems Review of Systems Per HPI  Physical Exam Triage Vital Signs ED Triage Vitals  Enc Vitals Group     BP 02/23/21 1402 (!) 146/91     Pulse Rate 02/23/21 1402 69     Resp 02/23/21 1402 18     Temp 02/23/21 1402 98.1 F (36.7 C)     Temp Source 02/23/21 1402 Oral     SpO2 02/23/21 1402 94 %     Weight 02/23/21 1404 (!) 360 lb (163.3 kg)     Height 02/23/21 1404 6\' 1"  (1.854 m)     Head Circumference --      Peak Flow --      Pain Score 02/23/21 1404 5     Pain Loc --      Pain Edu? --      Excl. in GC? --    No data found.  Updated Vital Signs BP (!) 146/91 (BP Location: Left Arm)  Pulse 69   Temp 98.1 F (36.7 C) (Oral)   Resp 18   Ht 6\' 1"  (1.854 m)   Wt (!) 360 lb (163.3 kg)   SpO2 94%   BMI 47.50 kg/m   Visual Acuity Right Eye Distance:   Left Eye Distance:   Bilateral Distance:    Right Eye Near:   Left Eye Near:    Bilateral Near:     Physical Exam Constitutional:      General: He is not in acute distress.    Appearance: Normal appearance. He is not toxic-appearing or diaphoretic.  HENT:     Head: Normocephalic and atraumatic.  Eyes:     Extraocular Movements: Extraocular movements intact.     Conjunctiva/sclera: Conjunctivae normal.  Pulmonary:     Effort: Pulmonary effort is normal.  Skin:    General: Skin is warm and dry.     Findings: Abscess present.          Comments: Approximately 5-7 centimeter in length by 2.5 cm in width indurated abscess that is currently draining on its own present to right mid back/side area.  Skin neurovascularly intact surrounding.  Neurological:     General: No focal deficit present.     Mental Status: He is alert and oriented to  person, place, and time. Mental status is at baseline.  Psychiatric:        Mood and Affect: Mood normal.        Behavior: Behavior normal.        Thought Content: Thought content normal.        Judgment: Judgment normal.     UC Treatments / Results  Labs (all labs ordered are listed, but only abnormal results are displayed) Labs Reviewed - No data to display  EKG   Radiology No results found.  Procedures Procedures (including critical care time)  Medications Ordered in UC Medications - No data to display  Initial Impression / Assessment and Plan / UC Course  I have reviewed the triage vital signs and the nursing notes.  Pertinent labs & imaging results that were available during my care of the patient were reviewed by me and considered in my medical decision making (see chart for details).     Will prescribe doxycycline x10 days due to successful treatment with this antibiotic previously.  Patient advised to use warm compresses as well.  No need for I&D at this time as abscess is indurated and is currently draining on its own.  Patient advised to follow-up with general surgeon for further evaluation and management of abscess given its size.  No red flags on exam.Discussed strict return precautions. Patient verbalized understanding and is agreeable with plan.  Final Clinical Impressions(s) / UC Diagnoses   Final diagnoses:  Abscess of back     Discharge Instructions      You have been prescribed doxycycline antibiotic to treat infection.  Please use warm compresses as well to soften of abscess.  Follow-up with general surgeon for further evaluation and management.     ED Prescriptions     Medication Sig Dispense Auth. Provider   doxycycline (VIBRAMYCIN) 100 MG capsule Take 1 capsule (100 mg total) by mouth 2 (two) times daily. 20 capsule , FNP      PDMP not reviewed this encounter.   Lance Muss, FNP 02/23/21 1435

## 2021-02-23 NOTE — ED Notes (Signed)
Called in lobby, no answer.  Tried cell phone, sent to voicemail.

## 2021-02-23 NOTE — ED Triage Notes (Signed)
Patient has an abscess on right lower back and requesting an antbs.  The area is draining, no pain, swelling going down.

## 2021-02-23 NOTE — Discharge Instructions (Addendum)
You have been prescribed doxycycline antibiotic to treat infection.  Please use warm compresses as well to soften of abscess.  Follow-up with general surgeon for further evaluation and management.

## 2021-03-19 ENCOUNTER — Ambulatory Visit: Payer: Self-pay | Admitting: Surgery

## 2021-03-25 ENCOUNTER — Encounter (HOSPITAL_BASED_OUTPATIENT_CLINIC_OR_DEPARTMENT_OTHER): Payer: Self-pay | Admitting: Surgery

## 2021-03-25 ENCOUNTER — Other Ambulatory Visit: Payer: Self-pay

## 2021-03-25 DIAGNOSIS — L723 Sebaceous cyst: Secondary | ICD-10-CM

## 2021-03-25 HISTORY — DX: Sebaceous cyst: L72.3

## 2021-03-25 NOTE — Progress Notes (Signed)
Spoke w/ via phone for pre-op interview---pt Lab needs dos----      none         Lab results------none COVID test -----patient states asymptomatic no test needed Arrive at -------1200 pm 03-27-2021 NPO after MN NO Solid Food.  Clear liquids from MN until---1100 am Med rec completed Medications to take morning of surgery -----none Diabetic medication -----n/a Patient instructed to bring photo id and insurance card day of surgery Patient aware to have Driver (ride ) / caregiver    for 24 hours after surgery  finace lynette eddings Patient Special Instructions -----none Pre-Op special Istructions -----none Patient verbalized understanding of instructions that were given at this phone interview. Patient denies shortness of breath, chest pain, fever, cough at this phone interview.

## 2021-03-27 ENCOUNTER — Ambulatory Visit (HOSPITAL_BASED_OUTPATIENT_CLINIC_OR_DEPARTMENT_OTHER): Payer: BC Managed Care – PPO | Admitting: Anesthesiology

## 2021-03-27 ENCOUNTER — Encounter (HOSPITAL_BASED_OUTPATIENT_CLINIC_OR_DEPARTMENT_OTHER): Payer: Self-pay | Admitting: Surgery

## 2021-03-27 ENCOUNTER — Ambulatory Visit (HOSPITAL_BASED_OUTPATIENT_CLINIC_OR_DEPARTMENT_OTHER)
Admission: RE | Admit: 2021-03-27 | Discharge: 2021-03-27 | Disposition: A | Discharge status: Home / Self Care | Payer: BC Managed Care – PPO | Attending: Surgery | Admitting: Surgery

## 2021-03-27 ENCOUNTER — Encounter (HOSPITAL_BASED_OUTPATIENT_CLINIC_OR_DEPARTMENT_OTHER)
Admission: RE | Disposition: A | Discharge status: Home / Self Care | Payer: Self-pay | Source: Home / Self Care | Attending: Surgery

## 2021-03-27 DIAGNOSIS — L720 Epidermal cyst: Secondary | ICD-10-CM | POA: Insufficient documentation

## 2021-03-27 DIAGNOSIS — Z87891 Personal history of nicotine dependence: Secondary | ICD-10-CM | POA: Diagnosis not present

## 2021-03-27 DIAGNOSIS — Z6841 Body Mass Index (BMI) 40.0 and over, adult: Secondary | ICD-10-CM | POA: Insufficient documentation

## 2021-03-27 DIAGNOSIS — L723 Sebaceous cyst: Secondary | ICD-10-CM | POA: Diagnosis not present

## 2021-03-27 HISTORY — PX: MASS EXCISION: SHX2000

## 2021-03-27 SURGERY — EXCISION MASS
Anesthesia: Monitor Anesthesia Care | Site: Neck

## 2021-03-27 MED ORDER — CEFAZOLIN IN SODIUM CHLORIDE 3-0.9 GM/100ML-% IV SOLN
3.0000 g | INTRAVENOUS | Status: AC
  Administered 2021-03-27: 3 g via INTRAVENOUS

## 2021-03-27 MED ORDER — CHLORHEXIDINE GLUCONATE CLOTH 2 % EX PADS: 6.0000 | MEDICATED_PAD | Freq: Once | CUTANEOUS | Status: DC

## 2021-03-27 MED ORDER — CEFAZOLIN IN SODIUM CHLORIDE 3-0.9 GM/100ML-% IV SOLN
INTRAVENOUS | Status: AC
  Filled 2021-03-27: qty 100

## 2021-03-27 MED ORDER — MIDAZOLAM HCL 2 MG/2ML IJ SOLN
INTRAMUSCULAR | Status: AC
  Filled 2021-03-27: qty 2

## 2021-03-27 MED ORDER — FENTANYL CITRATE (PF) 100 MCG/2ML IJ SOLN
INTRAMUSCULAR | Status: DC | PRN
  Administered 2021-03-27 (×2): 50 ug via INTRAVENOUS

## 2021-03-27 MED ORDER — 0.9 % SODIUM CHLORIDE (POUR BTL) OPTIME
TOPICAL | Status: DC | PRN
  Administered 2021-03-27: 500 mL

## 2021-03-27 MED ORDER — MIDAZOLAM HCL 5 MG/5ML IJ SOLN
INTRAMUSCULAR | Status: DC | PRN
  Administered 2021-03-27: 2 mg via INTRAVENOUS

## 2021-03-27 MED ORDER — OXYCODONE-ACETAMINOPHEN 5-325 MG PO TABS: 1.0000 | ORAL_TABLET | ORAL | 0 refills | Status: DC | PRN

## 2021-03-27 MED ORDER — BUPIVACAINE-EPINEPHRINE 0.25% -1:200000 IJ SOLN
INTRAMUSCULAR | Status: DC | PRN
  Administered 2021-03-27: 8 mL

## 2021-03-27 MED ORDER — FENTANYL CITRATE (PF) 100 MCG/2ML IJ SOLN
INTRAMUSCULAR | Status: AC
  Filled 2021-03-27: qty 2

## 2021-03-27 MED ORDER — LACTATED RINGERS IV SOLN: INTRAVENOUS | Status: DC

## 2021-03-27 SURGICAL SUPPLY — 41 items
ADH SKN CLS APL DERMABOND .7 (GAUZE/BANDAGES/DRESSINGS) ×1
APL PRP STRL LF DISP 70% ISPRP (MISCELLANEOUS) ×1
BLADE SURG 15 STRL LF DISP TIS (BLADE) ×1 IMPLANT
BLADE SURG 15 STRL SS (BLADE) ×2
CHLORAPREP W/TINT 26 (MISCELLANEOUS) ×2 IMPLANT
COVER BACK TABLE 60X90IN (DRAPES) ×2 IMPLANT
COVER MAYO STAND STRL (DRAPES) ×2 IMPLANT
DERMABOND ADVANCED (GAUZE/BANDAGES/DRESSINGS) ×1
DERMABOND ADVANCED .7 DNX12 (GAUZE/BANDAGES/DRESSINGS) ×1 IMPLANT
DRAPE LAPAROTOMY 100X72 PEDS (DRAPES) ×2 IMPLANT
DRAPE UTILITY XL STRL (DRAPES) ×2 IMPLANT
ELECT REM PT RETURN 9FT ADLT (ELECTROSURGICAL) ×2
ELECTRODE REM PT RTRN 9FT ADLT (ELECTROSURGICAL) ×1 IMPLANT
GAUZE 4X4 16PLY ~~LOC~~+RFID DBL (SPONGE) ×2 IMPLANT
GAUZE SPONGE 4X4 12PLY STRL (GAUZE/BANDAGES/DRESSINGS) ×2 IMPLANT
GLOVE SRG 8 PF TXTR STRL LF DI (GLOVE) ×1 IMPLANT
GLOVE SURG PR MICRO ENCORE 7.5 (GLOVE) ×2 IMPLANT
GLOVE SURG UNDER POLY LF SZ8 (GLOVE) ×2
GOWN STRL REUS W/TWL LRG LVL3 (GOWN DISPOSABLE) ×2 IMPLANT
KIT TURNOVER CYSTO (KITS) ×2 IMPLANT
NEEDLE HYPO 22GX1.5 SAFETY (NEEDLE) IMPLANT
NEEDLE HYPO 25X1 1.5 SAFETY (NEEDLE) ×2 IMPLANT
PACK BASIN DAY SURGERY FS (CUSTOM PROCEDURE TRAY) ×2 IMPLANT
PENCIL SMOKE EVACUATOR (MISCELLANEOUS) ×2 IMPLANT
SPONGE T-LAP 18X18 ~~LOC~~+RFID (SPONGE) IMPLANT
SPONGE T-LAP 4X18 ~~LOC~~+RFID (SPONGE) IMPLANT
SUCTION FRAZIER HANDLE 10FR (MISCELLANEOUS)
SUCTION TUBE FRAZIER 10FR DISP (MISCELLANEOUS) IMPLANT
SUT CHROMIC 3 0 SH 27 (SUTURE) IMPLANT
SUT ETHILON 2 0 FS 18 (SUTURE) IMPLANT
SUT MNCRL AB 4-0 PS2 18 (SUTURE) ×2 IMPLANT
SUT VIC AB 2-0 SH 27 (SUTURE)
SUT VIC AB 2-0 SH 27XBRD (SUTURE) IMPLANT
SUT VIC AB 3-0 SH 18 (SUTURE) IMPLANT
SUT VIC AB 3-0 SH 27 (SUTURE) ×2
SUT VIC AB 3-0 SH 27X BRD (SUTURE) ×1 IMPLANT
SYR BULB IRRIG 60ML STRL (SYRINGE) ×2 IMPLANT
SYR CONTROL 10ML LL (SYRINGE) ×2 IMPLANT
TOWEL OR 17X26 10 PK STRL BLUE (TOWEL DISPOSABLE) ×2 IMPLANT
TUBE CONNECTING 12X1/4 (SUCTIONS) ×2 IMPLANT
YANKAUER SUCT BULB TIP NO VENT (SUCTIONS) ×2 IMPLANT

## 2021-03-27 NOTE — Anesthesia Procedure Notes (Signed)
Procedure Name: MAC Date/Time: 03/27/2021 2:54 PM Performed by: Bonney Aid, CRNA Pre-anesthesia Checklist: Patient identified, Emergency Drugs available, Suction available, Patient being monitored and Timeout performed Patient Re-evaluated:Patient Re-evaluated prior to induction Oxygen Delivery Method: Simple face mask Placement Confirmation: positive ETCO2

## 2021-03-27 NOTE — H&P (Signed)
   Admitting Physician: Hyman Hopes Alianna Wurster  Service: General surgery  CC: neck cyst  Subjective   HPI: Grant Ramirez is an 35 y.o. male who is here for elective posterior neck cyst removal.  Past Medical History:  Diagnosis Date   Asthma    no current inhaler use   Gout    none recent   Sebaceous cyst 03/25/2021   neck    Past Surgical History:  Procedure Laterality Date   NO PAST SURGERIES      Family History  Problem Relation Age of Onset   Diabetes Father    Diabetes Paternal Grandmother     Social:  reports that he quit smoking about 5 months ago. His smoking use included cigarettes. He has a 3.75 pack-year smoking history. He has never used smokeless tobacco. He reports current alcohol use of about 1.0 standard drink per week. He reports current drug use. Drug: Marijuana.  Allergies:  Allergies  Allergen Reactions   Shellfish Allergy Anaphylaxis    Medications: Current Outpatient Medications  Medication Instructions   doxycycline (VIBRAMYCIN) 100 mg, Oral, 2 times daily    ROS - all of the below systems have been reviewed with the patient and positives are indicated with bold text General: chills, fever or night sweats Eyes: blurry vision or double vision ENT: epistaxis or sore throat Allergy/Immunology: itchy/watery eyes or nasal congestion Hematologic/Lymphatic: bleeding problems, blood clots or swollen lymph nodes Endocrine: temperature intolerance or unexpected weight changes Breast: new or changing breast lumps or nipple discharge Resp: cough, shortness of breath, or wheezing CV: chest pain or dyspnea on exertion GI: as per HPI GU: dysuria, trouble voiding, or hematuria MSK: joint pain or joint stiffness Neuro: TIA or stroke symptoms Derm: pruritus and skin lesion changes Psych: anxiety and depression  Objective   PE Blood pressure (!) 159/89, pulse 72, temperature 98.9 F (37.2 C), temperature source Oral, resp. rate 18, height 6\' 1"   (1.854 m), weight (!) 165.9 kg, SpO2 97 %. Constitutional: NAD; conversant; no deformities Eyes: Moist conjunctiva; no lid lag; anicteric; PERRL Neck: Trachea midline; no thyromegaly - posterior cyst marked in preop area Lungs: Normal respiratory effort; no tactile fremitus CV: RRR; no palpable thrills; no pitting edema GI: Abd soft, non-tender; no palpable hepatosplenomegaly MSK: Normal range of motion of extremities; no clubbing/cyanosis Psychiatric: Appropriate affect; alert and oriented x3 Lymphatic: No palpable cervical or axillary lymphadenopathy    No results found for this or any previous visit (from the past 24 hour(s)).  Imaging Orders  No imaging studies ordered today     Assessment and Plan   Grant Ramirez is an 35 y.o. male with a posterior neck cyst.  The procedure of posterior neck cyst excision was explained to the patient who granted consent to proceed.  We discussed the risks, benefits and alternatives. We will proceed as scheduled.   31, MD  [facility] Use [email or web address] to contact on call provider

## 2021-03-27 NOTE — Op Note (Signed)
   Patient: Grant Ramirez (1985/05/28, [id number])  Date of Surgery: 03/27/2021   Preoperative Diagnosis: POSTERIOR NECK SEBACEOUS CYST   Postoperative Diagnosis: POSTERIOR NECK SEBACEOUS CYST   Surgical Procedure: EXCISION OF POSTERIOR NECK SEBACEOUS CYST:    Operative Team Members:  Surgeon(s) and Role:    * Kayle Correa, Hyman Hopes, MD - Primary   Anesthesiologist: Eilene Ghazi, MD CRNA: Briant Sites, CRNA   Anesthesia: Monitor Anesthesia Care   Fluids:  Total I/O In: 100 [IV Piggyback:100] Out: 10 [Blood:10]  Complications: None  Drains:  none   Specimen:  ID Type Source Tests Collected by Time Destination  1 : Upper back cyst Tissue PATH Soft tissue SURGICAL PATHOLOGY Takai Chiaramonte, Hyman Hopes, MD 03/27/2021 1435      Disposition:  PACU - hemodynamically stable.  Plan of Care: Discharge to home after PACU    Indications for Procedure: Grant Ramirez is a 35 y.o. male who presented with a right posterior neck cyst.  I recommended removal.  The procedure itself as well as its risks, benefits and alternatives were discussed.  The risks discussed included but were not limited to the risk of infection, bleeding, damage to nearby structures, and wound healing issues.  After a full discussion and all questions answered the patient granted consent to proceed.  Findings: 1 cm x 1 cm x 1 cm cyst.   Description of Procedure:   On the date stated above the patient was taken operating room suite and placed in left lateral position.  Monitored anesthesia care was administered.  The right posterior neck which had been marked in the preoperative area was prepped and draped in usual sterile fashion.  A timeout was completed verifying the correct patient, procedure, position, and equipment needed for the case.  A elliptical incision was made around the scar from the head of the cyst and the previous incision and drainage.  The dissection was carried down to the subcutaneous tissues  using electrocautery and the cyst cavity was removed in total.  The cyst cavity was entered during the procedure spilling some cyst contents which was tan fluid. The cyst was passed off the field as a specimen.  The wound was irrigated.  Hemostasis was obtained using electrocautery.  The wound was closed in layers with Vicryl suture to close the deep tissues and Monocryl suture and Dermabond to close the skin.  All sponge and needle counts were correct at the end of this case.  At the end of the case we reviewed the infection status of the case. Patient: Private Patient Elective Case Case: Elective Infection Present At Time Of Surgery (PATOS):  Spillage of cyst contents.  Ivar Drape, MD General, Bariatric, & Minimally Invasive Surgery [facility], [place]

## 2021-03-27 NOTE — Anesthesia Postprocedure Evaluation (Signed)
Anesthesia Post Note  Patient: Grant Ramirez  Procedure(s) Performed: EXCISION OF POSTERIOR NECK SEBACEOUS CYST (Neck)     Patient location during evaluation: PACU Anesthesia Type: MAC Level of consciousness: awake and alert Pain management: pain level controlled Vital Signs Assessment: post-procedure vital signs reviewed and stable Respiratory status: spontaneous breathing, nonlabored ventilation, respiratory function stable and patient connected to nasal cannula oxygen Cardiovascular status: stable and blood pressure returned to baseline Postop Assessment: no apparent nausea or vomiting Anesthetic complications: no   No notable events documented.  Last Vitals:  Vitals:   03/27/21 1232 03/27/21 1521  BP: (!) 159/89 (!) 173/92  Pulse: 72 (!) 55  Resp: 18   Temp: 37.2 C (!) 36.3 C  SpO2: 97% 99%    Last Pain:  Vitals:   03/27/21 1232  TempSrc: Oral  PainSc: 0-No pain                 Benelli Winther S

## 2021-03-27 NOTE — Anesthesia Preprocedure Evaluation (Signed)
Anesthesia Evaluation  Patient identified by MRN, date of birth, ID band Patient awake    Reviewed: Allergy & Precautions, H&P , NPO status , Patient's Chart, lab work & pertinent test results  Airway Mallampati: II  TM Distance: >3 FB Neck ROM: Full    Dental no notable dental hx.    Pulmonary neg pulmonary ROS, Patient abstained from smoking., former smoker,    breath sounds clear to auscultation + decreased breath sounds      Cardiovascular negative cardio ROS Normal cardiovascular exam Rhythm:Regular Rate:Normal     Neuro/Psych negative neurological ROS  negative psych ROS   GI/Hepatic negative GI ROS, Neg liver ROS,   Endo/Other  Morbid obesity  Renal/GU negative Renal ROS  negative genitourinary   Musculoskeletal negative musculoskeletal ROS (+)   Abdominal (+) + obese,   Peds negative pediatric ROS (+)  Hematology negative hematology ROS (+)   Anesthesia Other Findings   Reproductive/Obstetrics negative OB ROS                             Anesthesia Physical Anesthesia Plan  ASA: 3  Anesthesia Plan: MAC   Post-op Pain Management:    Induction: Intravenous  PONV Risk Score and Plan: 1 and Propofol infusion and Treatment may vary due to age or medical condition  Airway Management Planned: Simple Face Mask  Additional Equipment:   Intra-op Plan:   Post-operative Plan:   Informed Consent: I have reviewed the patients History and Physical, chart, labs and discussed the procedure including the risks, benefits and alternatives for the proposed anesthesia with the patient or authorized representative who has indicated his/her understanding and acceptance.     Dental advisory given  Plan Discussed with: CRNA and Surgeon  Anesthesia Plan Comments:         Anesthesia Quick Evaluation

## 2021-03-27 NOTE — Discharge Instructions (Signed)

## 2021-03-27 NOTE — Transfer of Care (Signed)
Immediate Anesthesia Transfer of Care Note  Patient: Grant Ramirez  Procedure(s) Performed: EXCISION OF POSTERIOR NECK SEBACEOUS CYST (Neck)  Patient Location: PACU and Short Stay  Anesthesia Type:MAC  Level of Consciousness: awake, alert  and oriented  Airway & Oxygen Therapy: Patient Spontanous Breathing  Post-op Assessment: Report given to RN  Post vital signs: Reviewed and stable  Last Vitals:  Vitals Value Taken Time  BP 173/92 03/27/21 1522  Temp    Pulse 62 03/27/21 1524  Resp    SpO2 100 % 03/27/21 1524  Vitals shown include unvalidated device data.  Last Pain:  Vitals:   03/27/21 1232  TempSrc: Oral  PainSc: 0-No pain      Patients Stated Pain Goal: 6 (52/84/13 2440)  Complications: No notable events documented.

## 2021-03-30 ENCOUNTER — Encounter (HOSPITAL_BASED_OUTPATIENT_CLINIC_OR_DEPARTMENT_OTHER): Payer: Self-pay | Admitting: Surgery

## 2021-03-30 LAB — SURGICAL PATHOLOGY

## 2021-04-03 ENCOUNTER — Other Ambulatory Visit: Payer: Self-pay

## 2021-04-03 ENCOUNTER — Encounter: Payer: Self-pay | Admitting: Emergency Medicine

## 2021-04-03 ENCOUNTER — Ambulatory Visit
Admission: EM | Admit: 2021-04-03 | Discharge: 2021-04-03 | Disposition: A | Discharge status: Home / Self Care | Payer: BC Managed Care – PPO | Attending: Physician Assistant | Admitting: Physician Assistant

## 2021-04-03 DIAGNOSIS — L0291 Cutaneous abscess, unspecified: Secondary | ICD-10-CM | POA: Diagnosis not present

## 2021-04-03 MED ORDER — DOXYCYCLINE HYCLATE 100 MG PO CAPS: 100.0000 mg | ORAL_CAPSULE | Freq: Two times a day (BID) | ORAL | 0 refills | Status: DC

## 2021-04-03 NOTE — ED Triage Notes (Signed)
Had surgery to remove a cyst on the back of his neck last week. Last night noticed drainage on his shirt that had a bad odor to it from the incision site and is worried it's infected. No visible drainage expelled from the site on inspection. Area warm to touch in triage.

## 2021-04-03 NOTE — ED Provider Notes (Signed)
[name]    CSN: [id number] Arrival date & time: 04/03/21  0800      History   Chief Complaint Chief Complaint  Patient presents with   Wound Infection    HPI Grant Ramirez is a 35 y.o. male.   Patient here today for evaluation of drainage from a cyst he had surgery on last week.  He states that he has not had drainage until last night he had some drainage that was malodorous. He is concerned for infection.  He denies any fever.  He has not had any nausea or vomiting.  The history is provided by the patient.   Past Medical History:  Diagnosis Date   Asthma    no current inhaler use   Gout    none recent   Sebaceous cyst 03/25/2021   neck    There are no problems to display for this patient.   Past Surgical History:  Procedure Laterality Date   MASS EXCISION N/A 03/27/2021   Procedure: EXCISION OF POSTERIOR NECK SEBACEOUS CYST;  Surgeon: Quentin Ore, MD;  Location:  S[facility];  Service: General;  Laterality: N/A;   NO PAST SURGERIES         Home Medications    Prior to Admission medications   Medication Sig Start Date End Date Taking? Authorizing Provider  doxycycline (VIBRAMYCIN) 100 MG capsule Take 1 capsule (100 mg total) by mouth 2 (two) times daily. 04/03/21  Yes Tomi Bamberger, PA-C  oxyCODONE-acetaminophen (PERCOCET) 5-325 MG tablet Take 1 tablet by mouth every 4 (four) hours as needed for severe pain. 03/27/21 03/27/22  Stechschulte, Hyman Hopes, MD  colchicine 0.6 MG tablet Take 0.6 mg by mouth daily.  12/13/19  [provider]    Family History Family History  Problem Relation Age of Onset   Diabetes Father    Diabetes Paternal Grandmother     Social History Social History   Tobacco Use   Smoking status: Former    Packs/day: 0.25    Years: 15.00    Pack years: 3.75    Types: Cigarettes    Quit date: 10/08/2020    Years since quitting: 0.4   Smokeless tobacco: Never  Vaping Use   Vaping  Use: Never used  Substance Use Topics   Alcohol use: Yes    Alcohol/week: 1.0 standard drink    Types: 1 Shots of liquor per week    Comment: occassionally   Drug use: Yes    Types: Marijuana    Comment: marijuana smoking daily     Allergies   Shellfish allergy   Review of Systems Review of Systems  Constitutional:  Negative for chills and fever.  Eyes:  Negative for discharge and redness.  Respiratory:  Negative for shortness of breath.   Gastrointestinal:  Negative for nausea and vomiting.  Skin:  Positive for color change and wound.  Neurological:  Negative for numbness.    Physical Exam Triage Vital Signs ED Triage Vitals [04/03/21 0811]  Enc Vitals Group     BP (!) 146/80     Pulse Rate 81     Resp 16     Temp 98.5 F (36.9 C)     Temp Source Oral     SpO2 97 %     Weight      Height      Head Circumference      Peak Flow      Pain Score 5  Pain Loc      Pain Edu?      Excl. in GC?    No data found.  Updated Vital Signs BP (!) 146/80 (BP Location: Left Arm)   Pulse 81   Temp 98.5 F (36.9 C) (Oral)   Resp 16   SpO2 97%      Physical Exam Vitals and nursing note reviewed.  Constitutional:      General: He is not in acute distress.    Appearance: Normal appearance. He is not ill-appearing.  HENT:     Head: Normocephalic and atraumatic.  Eyes:     Conjunctiva/sclera: Conjunctivae normal.  Cardiovascular:     Rate and Rhythm: Normal rate.  Pulmonary:     Effort: Pulmonary effort is normal.  Skin:    Comments: Incision to upper back with skin glue still noted and present.  No active drainage at this time.  No significant erythema noted.  Neurological:     Mental Status: He is alert.  Psychiatric:        Mood and Affect: Mood normal.        Behavior: Behavior normal.        Thought Content: Thought content normal.     UC Treatments / Results  Labs (all labs ordered are listed, but only abnormal results are displayed) Labs Reviewed  - No data to display  EKG   Radiology No results found.  Procedures Procedures (including critical care time)  Medications Ordered in UC Medications - No data to display  Initial Impression / Assessment and Plan / UC Course  I have reviewed the triage vital signs and the nursing notes.  Pertinent labs & imaging results that were available during my care of the patient were reviewed by me and considered in my medical decision making (see chart for details).    Will prescribe doxycycline to cover possible secondary infection.  Recommended follow-up if no improvement or symptoms worsen.  Discussed that some drainage might be a result of healing process, however patient reports that there was a very significant odor and would like to proceed with treatment to cover infection.  Final Clinical Impressions(s) / UC Diagnoses   Final diagnoses:  Abscess   Discharge Instructions   None    ED Prescriptions     Medication Sig Dispense Auth. Provider   doxycycline (VIBRAMYCIN) 100 MG capsule Take 1 capsule (100 mg total) by mouth 2 (two) times daily. 20 capsule Tomi Bamberger, PA-C      PDMP not reviewed this encounter.   Tomi Bamberger, PA-C 04/03/21 [phone]

## 2021-04-22 ENCOUNTER — Ambulatory Visit: Payer: BC Managed Care – PPO | Admitting: Family

## 2021-06-04 ENCOUNTER — Ambulatory Visit
Admission: EM | Admit: 2021-06-04 | Discharge: 2021-06-04 | Disposition: A | Discharge status: Home / Self Care | Payer: BC Managed Care – PPO | Attending: Emergency Medicine | Admitting: Emergency Medicine

## 2021-06-04 ENCOUNTER — Other Ambulatory Visit: Payer: Self-pay

## 2021-06-04 ENCOUNTER — Encounter: Payer: Self-pay | Admitting: Emergency Medicine

## 2021-06-04 DIAGNOSIS — L089 Local infection of the skin and subcutaneous tissue, unspecified: Secondary | ICD-10-CM

## 2021-06-04 DIAGNOSIS — L723 Sebaceous cyst: Secondary | ICD-10-CM

## 2021-06-04 MED ORDER — SULFAMETHOXAZOLE-TRIMETHOPRIM 800-160 MG PO TABS: 2.0000 | ORAL_TABLET | Freq: Two times a day (BID) | ORAL | 0 refills | Status: AC

## 2021-06-04 MED ORDER — CEPHALEXIN 500 MG PO CAPS: 1000.0000 mg | ORAL_CAPSULE | Freq: Two times a day (BID) | ORAL | 0 refills | Status: AC

## 2021-06-04 NOTE — ED Provider Notes (Signed)
[facility]    CSN: [id number] Arrival date & time: 06/04/21  1715    HISTORY   Chief Complaint  Patient presents with   Abscess   HPI Grant KaplanDashaa J Jansma is a 36 y.o. male. Patient returns for follow-up of a sebaceous cyst for which she was previously seen here at this clinic.  Patient states that the provider that saw him referred him to a surgical clinic, states in November he had it surgically removed.  Patient states that the cyst is starting to return in the same location.  States he noticed that it did ago it became more swollen and painful.  Patient states he also has new cyst forming on his right hip and left hip as well.  Patient is requesting renewal of referral to surgical clinic.  The history is provided by the patient.  Past Medical History:  Diagnosis Date   Asthma    no current inhaler use   Gout    none recent   Sebaceous cyst 03/25/2021   neck   There are no problems to display for this patient.  Past Surgical History:  Procedure Laterality Date   MASS EXCISION N/A 03/27/2021   Procedure: EXCISION OF POSTERIOR NECK SEBACEOUS CYST;  Surgeon: Quentin OreStechschulte, Paul J, MD;  Location: [facility];  Service: General;  Laterality: N/A;   NO PAST SURGERIES      Home Medications    Prior to Admission medications   Medication Sig Start Date End Date Taking? Authorizing Provider  cephALEXin (KEFLEX) 500 MG capsule Take 2 capsules (1,000 mg total) by mouth 2 (two) times daily for 14 days. 06/04/21 06/18/21 Yes Theadora RamaMorgan, Tejuan Gholson Scales, PA-C  sulfamethoxazole-trimethoprim (BACTRIM DS) 800-160 MG tablet Take 2 tablets by mouth 2 (two) times daily for 14 days. 06/04/21 06/18/21 Yes Theadora RamaMorgan, Tashari Schoenfelder Scales, PA-C  oxyCODONE-acetaminophen (PERCOCET) 5-325 MG tablet Take 1 tablet by mouth every 4 (four) hours as needed for severe pain. 03/27/21 03/27/22  Stechschulte, Hyman HopesPaul J, MD  colchicine 0.6 MG tablet Take 0.6 mg by mouth daily.  12/13/19  [provider]    Family History Family History  Problem Relation Age of Onset   Diabetes Father    Diabetes Paternal Grandmother    Social History Social History   Tobacco Use   Smoking status: Former    Packs/day: 0.25    Years: 15.00    Pack years: 3.75    Types: Cigarettes    Quit date: 10/08/2020    Years since quitting: 0.6   Smokeless tobacco: Never  Vaping Use   Vaping Use: Never used  Substance Use Topics   Alcohol use: Yes    Alcohol/week: 1.0 standard drink    Types: 1 Shots of liquor per week    Comment: occassionally   Drug use: Yes    Types: Marijuana    Comment: marijuana smoking daily   Allergies   Shellfish allergy  Review of Systems Review of Systems Pertinent findings noted in history of present illness.   Physical Exam Triage Vital Signs ED Triage Vitals  Enc Vitals Group     BP 03/06/21 0827 (!) 147/82     Pulse Rate 03/06/21 0827 72     Resp 03/06/21 0827 18     Temp 03/06/21 0827 98.3 F (36.8 C)     Temp Source 03/06/21 0827 Oral     SpO2 03/06/21 0827 98 %     Weight --      Height --  Head Circumference --      Peak Flow --      Pain Score 03/06/21 0826 5     Pain Loc --      Pain Edu? --      Excl. in GC? --   No data found.  Updated Vital Signs BP 124/87 (BP Location: Left Arm)    Pulse 87    Temp 98.3 F (36.8 C) (Oral)    Resp 16    SpO2 98%   Physical Exam Vitals and nursing note reviewed.  Constitutional:      General: He is not in acute distress.    Appearance: Normal appearance. He is not ill-appearing.  HENT:     Head: Normocephalic and atraumatic.  Eyes:     Conjunctiva/sclera: Conjunctivae normal.  Cardiovascular:     Rate and Rhythm: Normal rate.  Pulmonary:     Effort: Pulmonary effort is normal.  Skin:    Comments: Well-healed incision on the right side posterior neck.  No drainage or surrounding erythema appreciated.  Beneath incision, there is fluctuance without warmth.  Patient states that it is  tender to palpation.   Neurological:     Mental Status: He is alert.  Psychiatric:        Mood and Affect: Mood normal.        Behavior: Behavior normal.        Thought Content: Thought content normal.    Visual Acuity Right Eye Distance:   Left Eye Distance:   Bilateral Distance:    Right Eye Near:   Left Eye Near:    Bilateral Near:     UC Couse / Diagnostics / Procedures:    EKG  Radiology No results found.  Procedures Procedures (including critical [facility] time)  UC Diagnoses / Final Clinical Impressions(s)   I have reviewed the triage vital signs and the nursing notes.  Pertinent labs & imaging results that were available during my [facility] of the patient were reviewed by me and considered in my medical decision making (see chart for details).    Final diagnoses:  Infected sebaceous cyst of skin  Morbid obesity (HCC)   Believe the patient has had a recurrence of the sebaceous cyst that was previously removed from his neck.  I also believe that at this time, it is possible that it is infected.  I advised patient to reach out to the surgeon that performed his surgery back in November, his referral should still be active given that this is the same issue.  Patient was also provided with 2 antibiotics, Bactrim and Keflex to resolve any active infection in the lesion at this time in preparation for repeat evaluation, possible redo removal.  Return precautions advised.  ED Prescriptions     Medication Sig Dispense Auth. Provider   sulfamethoxazole-trimethoprim (BACTRIM DS) 800-160 MG tablet Take 2 tablets by mouth 2 (two) times daily for 14 days. 56 tablet Theadora Rama Scales, PA-C   cephALEXin (KEFLEX) 500 MG capsule Take 2 capsules (1,000 mg total) by mouth 2 (two) times daily for 14 days. 56 capsule Theadora Rama Scales, PA-C      PDMP not reviewed this encounter.  Pending results:  Labs Reviewed - No data to display  Medications Ordered in UC: Medications - No  data to display  Disposition Upon Discharge:  Condition: stable for discharge home Home: take medications as prescribed; routine discharge instructions as discussed; follow up as advised.  Patient presented with an acute illness with associated  systemic symptoms and significant discomfort requiring urgent management. In my opinion, this is a condition that a prudent lay person (someone who possesses an average knowledge of health and medicine) may potentially expect to result in complications if not addressed urgently such as respiratory distress, impairment of bodily function or dysfunction of bodily organs.   Routine symptom specific, illness specific and/or disease specific instructions were discussed with the patient and/or caregiver at length.   As such, the patient has been evaluated and assessed, work-up was performed and treatment was provided in alignment with urgent [facility] protocols and evidence based medicine.  Patient/parent/caregiver has been advised that the patient may require follow up for further testing and treatment if the symptoms continue in spite of treatment, as clinically indicated and appropriate.  If the patient was tested for COVID-19, Influenza and/or RSV, then the patient/parent/guardian was advised to isolate at home pending the results of his/her diagnostic coronavirus test and potentially longer if theyre positive. I have also advised pt that if his/her COVID-19 test returns positive, it's recommended to self-isolate for at least 10 days after symptoms first appeared AND until fever-free for 24 hours without fever reducer AND other symptoms have improved or resolved. Discussed self-isolation recommendations as well as instructions for household member/close contacts as per the [facility] and [place] DHHS, and also gave patient the COVID packet with this information.  Patient/parent/caregiver has been advised to return to the [facility] or PCP in 3-5 days if no better; to PCP or the  Emergency Department if new signs and symptoms develop, or if the current signs or symptoms continue to change or worsen for further workup, evaluation and treatment as clinically indicated and appropriate  The patient will follow up with their current PCP if and as advised. If the patient does not currently have a PCP we will assist them in obtaining one.   The patient may need specialty follow up if the symptoms continue, in spite of conservative treatment and management, for further workup, evaluation, consultation and treatment as clinically indicated and appropriate.   Patient/parent/caregiver verbalized understanding and agreement of plan as discussed.  All questions were addressed during visit.  Please see discharge instructions below for further details of plan.  Discharge Instructions:   Discharge Instructions      Please reach out to Dr. Ivar Drape at [facility] to schedule follow-up appointment.  You will not need a referral as you are previously established with them already.  In the meantime, I recommend that you begin taking antibiotics to treat the infection in the lesion on the back of her neck.  Is much better to have a cyst removed when it is not inflamed or infected so hopefully by the time you see Dr. Dossie Der, the inflammation will be completely gone.  I provided you with 2 antibiotics prescriptions, Keflex, 2 capsules twice daily for 14 days and Bactrim, 2 tablets twice daily for 14 days.  Please take all of them exactly as prescribed, please do not skip doses.  Failure to take all antibiotics as prescribed can cause worsening infection and more serious consequences such as sepsis.  Thank you for visiting urgent [facility] today.  Appreciate the opportunity to participate in your [facility].  Please feel free to return if you feel that these antibiotics are not helping.    This office note has been dictated using Teaching laboratory technician.  Unfortunately,  and despite my best efforts, this method of dictation can sometimes lead to occasional typographical or grammatical  errors.  I apologize in advance if this occurs.     Theadora RamaMorgan, Kamee Bobst Scales, PA-C 06/04/21 1920

## 2021-06-04 NOTE — ED Triage Notes (Signed)
We referred him for surgery for a cyst on the back of his neck last year, he had the surgery in November to have it removed. The cyst on the back of his neck began to come back again this past Sunday and is painful. Now also has cysts on his right hip and left hip

## 2021-06-04 NOTE — Discharge Instructions (Addendum)
Please reach out to Dr. Louanna Raw at [facility] to schedule follow-up appointment.  You will not need a referral as you are previously established with them already.  In the meantime, I recommend that you begin taking antibiotics to treat the infection in the lesion on the back of her neck.  Is much better to have a cyst removed when it is not inflamed or infected so hopefully by the time you see Dr. Thermon Leyland, the inflammation will be completely gone.  I provided you with 2 antibiotics prescriptions, Keflex, 2 capsules twice daily for 14 days and Bactrim, 2 tablets twice daily for 14 days.  Please take all of them exactly as prescribed, please do not skip doses.  Failure to take all antibiotics as prescribed can cause worsening infection and more serious consequences such as sepsis.  Thank you for visiting urgent care today.  Appreciate the opportunity to participate in your care.  Please feel free to return if you feel that these antibiotics are not helping.

## 2021-06-20 ENCOUNTER — Other Ambulatory Visit: Payer: Self-pay

## 2021-06-20 ENCOUNTER — Encounter: Payer: Self-pay | Admitting: Emergency Medicine

## 2021-06-20 ENCOUNTER — Ambulatory Visit
Admission: EM | Admit: 2021-06-20 | Discharge: 2021-06-20 | Disposition: A | Discharge status: Home / Self Care | Payer: BC Managed Care – PPO | Attending: Internal Medicine | Admitting: Internal Medicine

## 2021-06-20 DIAGNOSIS — L0211 Cutaneous abscess of neck: Secondary | ICD-10-CM | POA: Diagnosis not present

## 2021-06-20 MED ORDER — CLINDAMYCIN HCL 150 MG PO CAPS: 300.0000 mg | ORAL_CAPSULE | Freq: Four times a day (QID) | ORAL | 0 refills | Status: AC

## 2021-06-20 NOTE — ED Triage Notes (Signed)
Patient had surgery to remove a cyst on the back of his neck. It came back. Last time he was seen here we gave him Cephalexin, he made a follow up with his surgeon but doesn't see them until 07/07/2021. He finished his antibiotics, but the cyst has returned, is requesting a refill until he can get in to see the surgeons.

## 2021-06-20 NOTE — Discharge Instructions (Signed)
You have been prescribed another antibiotic.  It may be important to take probiotics.  Follow-up with general surgery on Monday to try to schedule a sooner appointment.

## 2021-06-20 NOTE — ED Provider Notes (Signed)
[name]    CSN: [id number] Arrival date & time: 06/20/21  1102      History   Chief Complaint Chief Complaint  Patient presents with   Medication Refill    HPI Grant [facility] is a 36 y.o. male.   Patient presents today for concerns for recurrence of infection to abscess to posterior neck.  Patient had surgery on 03/27/2021 for removal.  He then came to urgent care on 04/03/2021 due to concerns for surgical site infection.  He was prescribed doxycycline antibiotic with resolution.  He had office visit with general surgery for follow-up on 04/20/2021 with adequate healing.  He presented again to urgent care on 06/04/2021 and received cephalexin and Bactrim due to concerns for infection.  He completed these medications on Thursday.  Patient reports that infection has seemed to clear with antibiotics but returned once they were completed.  He reports there is some swelling located to surgical site.  Denies any pain or drainage.  Denies fever, body aches, chills.  He has appointment with general surgery on 2/28.   Medication Refill  Past Medical History:  Diagnosis Date   Asthma    no current inhaler use   Gout    none recent   Sebaceous cyst 03/25/2021   neck    There are no problems to display for this patient.   Past Surgical History:  Procedure Laterality Date   MASS EXCISION N/A 03/27/2021   Procedure: EXCISION OF POSTERIOR NECK SEBACEOUS CYST;  Surgeon: Quentin Ore, MD;  Location: [facility];  Service: General;  Laterality: N/A;   NO PAST SURGERIES         Home Medications    Prior to Admission medications   Medication Sig Start Date End Date Taking? Authorizing Provider  clindamycin (CLEOCIN) 150 MG capsule Take 2 capsules (300 mg total) by mouth 4 (four) times daily for 5 days. 06/20/21 06/25/21 Yes Enrrique Mierzwa, Acie Fredrickson, FNP  oxyCODONE-acetaminophen (PERCOCET) 5-325 MG tablet Take 1 tablet by mouth every 4 (four) hours as needed  for severe pain. 03/27/21 03/27/22  Stechschulte, Hyman Hopes, MD  colchicine 0.6 MG tablet Take 0.6 mg by mouth daily.  12/13/19  [provider]    Family History Family History  Problem Relation Age of Onset   Diabetes Father    Diabetes Paternal Grandmother     Social History Social History   Tobacco Use   Smoking status: Former    Packs/day: 0.25    Years: 15.00    Pack years: 3.75    Types: Cigarettes    Quit date: 10/08/2020    Years since quitting: 0.6   Smokeless tobacco: Never  Vaping Use   Vaping Use: Never used  Substance Use Topics   Alcohol use: Yes    Alcohol/week: 1.0 standard drink    Types: 1 Shots of liquor per week    Comment: occassionally   Drug use: Yes    Types: Marijuana    Comment: marijuana smoking daily     Allergies   Shellfish allergy   Review of Systems Review of Systems Per HPI  Physical Exam Triage Vital Signs ED Triage Vitals [06/20/21 1128]  Enc Vitals Group     BP 131/81     Pulse Rate 85     Resp 16     Temp 98.1 F (36.7 C)     Temp Source Oral     SpO2 96 %     Weight  Height      Head Circumference      Peak Flow      Pain Score 0     Pain Loc      Pain Edu?      Excl. in GC?    No data found.  Updated Vital Signs BP 131/81 (BP Location: Left Arm)    Pulse 85    Temp 98.1 F (36.7 C) (Oral)    Resp 16    SpO2 96%   Visual Acuity Right Eye Distance:   Left Eye Distance:   Bilateral Distance:    Right Eye Near:   Left Eye Near:    Bilateral Near:     Physical Exam Constitutional:      General: He is not in acute distress.    Appearance: Normal appearance. He is not toxic-appearing or diaphoretic.  HENT:     Head: Normocephalic and atraumatic.  Eyes:     Extraocular Movements: Extraocular movements intact.     Conjunctiva/sclera: Conjunctivae normal.  Pulmonary:     Effort: Pulmonary effort is normal.  Skin:    General: Skin is warm and dry.     Findings: Abscess present.      Comments: Patient has approximately 2.5 to 3 cm fluctuant abscess present to posterior neck.  There is surgical scar present.  No erythema or drainage noted.  Neurological:     General: No focal deficit present.     Mental Status: He is alert and oriented to person, place, and time. Mental status is at baseline.  Psychiatric:        Mood and Affect: Mood normal.        Behavior: Behavior normal.        Thought Content: Thought content normal.        Judgment: Judgment normal.     UC Treatments / Results  Labs (all labs ordered are listed, but only abnormal results are displayed) Labs Reviewed - No data to display  EKG   Radiology No results found.  Procedures Procedures (including critical care time)  Medications Ordered in UC Medications - No data to display  Initial Impression / Assessment and Plan / UC Course  I have reviewed the triage vital signs and the nursing notes.  Pertinent labs & imaging results that were available during my care of the patient were reviewed by me and considered in my medical decision making (see chart for details).     Vital signs are stable so do not think the patient is in need of immediate medical attention at the hospital or by general surgery at this time.  I&D not indicated as there is just mild fluctuance noted.  There is concern for possible infection underlying surgical scar.  Will treat with clindamycin antibiotic.  Although, patient was advised of the risk of receiving multiple antibiotics recently.  Advised patient that it may be beneficial to take a probiotic.  Patient has appointment with general surgery on 2/28 but advised patient to follow-up sooner if possible.  The next steps will be follow-up with general surgery for further evaluation and management.  Discussed strict return and ER precautions.  Patient verbalized understanding and was agreeable with plan. Final Clinical Impressions(s) / UC Diagnoses   Final diagnoses:   Abscess, neck     Discharge Instructions      You have been prescribed another antibiotic.  It may be important to take probiotics.  Follow-up with general surgery on Monday to try to schedule  a sooner appointment.    ED Prescriptions     Medication Sig Dispense Auth. Provider   clindamycin (CLEOCIN) 150 MG capsule Take 2 capsules (300 mg total) by mouth 4 (four) times daily for 5 days. 40 capsule Beaver, Acie Fredrickson, [place]      PDMP not reviewed this encounter.   Gustavus Bryant, [place] 06/20/21 1255

## 2021-07-16 DIAGNOSIS — L0211 Cutaneous abscess of neck: Secondary | ICD-10-CM | POA: Diagnosis not present

## 2021-08-16 ENCOUNTER — Encounter: Payer: Self-pay | Admitting: Emergency Medicine

## 2021-08-16 ENCOUNTER — Ambulatory Visit
Admission: EM | Admit: 2021-08-16 | Discharge: 2021-08-16 | Disposition: A | Discharge status: Home / Self Care | Payer: BC Managed Care – PPO | Attending: Student | Admitting: Student

## 2021-08-16 DIAGNOSIS — L089 Local infection of the skin and subcutaneous tissue, unspecified: Secondary | ICD-10-CM | POA: Diagnosis not present

## 2021-08-16 DIAGNOSIS — L723 Sebaceous cyst: Secondary | ICD-10-CM | POA: Diagnosis not present

## 2021-08-16 MED ORDER — DOXYCYCLINE HYCLATE 100 MG PO CAPS: 100.0000 mg | ORAL_CAPSULE | Freq: Two times a day (BID) | ORAL | 0 refills | Status: AC

## 2021-08-16 NOTE — ED Triage Notes (Signed)
Patient requesting antibiotics for a cyst on the back of his neck.  Patient has had the cyst x 6 months that comes and goes.  The area started swelling up yesterday.  Denies any drainage or fever. ?

## 2021-08-16 NOTE — Discharge Instructions (Addendum)
-  Doxycycline twice daily for 7 days.  Make sure to wear sunscreen while spending time outside while on this medication as it can increase your chance of sunburn. You can take this medication with food if you have a sensitive stomach. ?-Warm compresses ?-F/u with surgery or derm if symptoms persist. Could also f/u with [place] if needed. ?-declines AVS ?

## 2021-08-16 NOTE — ED Provider Notes (Signed)
?[facility] ? ? ? ?CSN: [id number] ?Arrival date & time: 08/16/21  4854 ? ? ?  ? ?History   ?Chief Complaint ?Chief Complaint  ?Patient presents with  ? Abscess  ? ? ?HPI ?Grant Ramirez is a 36 y.o. male presents today for concerns for recurrence of infection to abscess to posterior neck.  Patient had surgery on 03/27/2021 for removal.  He then came to urgent care on 04/03/2021 due to concerns for surgical site infection.  He was prescribed doxycycline antibiotic with resolution.  He had office visit with general surgery for follow-up on 04/20/2021 with adequate healing.  He presented again to urgent care on 06/04/2021 and received cephalexin and Bactrim due to concerns for infection.  then received clindamycin 06/20/21  Patient reports that infection has seemed to clear with antibiotics but returned once they were completed. Had appointment with general surgery on 2/28 - states this was uneventful (I do not have access to these records). Has also met with dermatology (unsure of date) and said they tried to drain the abscess with minimal success. today endorses about 1 day of recurrent swelling and pain posterior neck.  Denies any pain or drainage.  Denies fever, body aches, chills.  requesting abx before it gets worse. ? ?HPI ? ?Past Medical History:  ?Diagnosis Date  ? Asthma   ? no current inhaler use  ? Gout   ? none recent  ? Sebaceous cyst 03/25/2021  ? neck  ? ? ?There are no problems to display for this patient. ? ? ?Past Surgical History:  ?Procedure Laterality Date  ? MASS EXCISION N/A 03/27/2021  ? Procedure: EXCISION OF POSTERIOR NECK SEBACEOUS CYST;  Surgeon: Felicie Morn, MD;  Location: [facility];  Service: General;  Laterality: N/A;  ? NO PAST SURGERIES    ? ? ? ? ? ?Home Medications   ? ?Prior to Admission medications   ?Medication Sig Start Date End Date Taking? Authorizing Provider  ?doxycycline (VIBRAMYCIN) 100 MG capsule Take 1 capsule (100 mg total) by mouth 2  (two) times daily for 7 days. 08/16/21 08/23/21 Yes Hazel Sams, PA-C  ?colchicine 0.6 MG tablet Take 0.6 mg by mouth daily.  12/13/19  [provider]  ? ? ?Family History ?Family History  ?Problem Relation Age of Onset  ? Diabetes Father   ? Diabetes Paternal Grandmother   ? ? ?Social History ?Social History  ? ?Tobacco Use  ? Smoking status: Former  ?  Packs/day: 0.25  ?  Years: 15.00  ?  Pack years: 3.75  ?  Types: Cigarettes  ?  Quit date: 10/08/2020  ?  Years since quitting: 0.8  ? Smokeless tobacco: Never  ?Vaping Use  ? Vaping Use: Never used  ?Substance Use Topics  ? Alcohol use: Yes  ?  Alcohol/week: 1.0 standard drink  ?  Types: 1 Shots of liquor per week  ?  Comment: occassionally  ? Drug use: Yes  ?  Types: Marijuana  ?  Comment: marijuana smoking daily  ? ? ? ?Allergies   ?Shellfish allergy ? ? ?Review of Systems ?Review of Systems  ?Skin:   ?     abscess  ?All other systems reviewed and are negative. ? ? ?Physical Exam ?Triage Vital Signs ?ED Triage Vitals  ?Enc Vitals Group  ?   BP   ?   Pulse   ?   Resp   ?   Temp   ?   Temp src   ?  SpO2   ?   Weight   ?   Height   ?   Head Circumference   ?   Peak Flow   ?   Pain Score   ?   Pain Loc   ?   Pain Edu?   ?   Excl. in [place]?   ? ?No data found. ? ?Updated Vital Signs ?BP 138/88 (BP Location: Left Arm)   Pulse 65   Temp 98 ?F (36.7 ?C) (Oral)   Resp 18   Ht _0  (1.854 m)   Wt (!) 350 lb (158.8 kg)   SpO2 97%   BMI 46.18 kg/m?  ? ?Visual Acuity ?Right Eye Distance:   ?Left Eye Distance:   ?Bilateral Distance:   ? ?Right Eye Near:   ?Left Eye Near:    ?Bilateral Near:    ? ?Physical Exam ?Vitals reviewed.  ?Constitutional:   ?   General: He is not in acute distress. ?   Appearance: Normal appearance. He is not ill-appearing.  ?HENT:  ?   Head: Normocephalic and atraumatic.  ?Pulmonary:  ?   Effort: Pulmonary effort is normal.  ?Skin: ?   Comments: See image below ?Posterior neck with small 1x2cm area of induration, TTP and without  fluctuance. No surrounding skin changes, erythema, or warmth. Well-healed overlying surgical scar.  ?Neurological:  ?   General: No focal deficit present.  ?   Mental Status: He is alert and oriented to person, place, and time.  ?Psychiatric:     ?   Mood and Affect: Mood normal.     ?   Behavior: Behavior normal.     ?   Thought Content: Thought content normal.     ?   Judgment: Judgment normal.  ? ? ? ? ?UC Treatments / Results  ?Labs ?(all labs ordered are listed, but only abnormal results are displayed) ?Labs Reviewed - No data to display ? ?EKG ? ? ?Radiology ?No results found. ? ?Procedures ?Procedures (including critical care time) ? ?Medications Ordered in UC ?Medications - No data to display ? ?Initial Impression / Assessment and Plan / UC Course  ?I have reviewed the triage vital signs and the nursing notes. ? ?Pertinent labs & imaging results that were available during my care of the patient were reviewed by me and considered in my medical decision making (see chart for details). ? ?  ? ?This patient is a very pleasant 36 y.o. year old male presenting with abscess - posterior neck. Recurrent abscess. Has followed with both general surgery and derm for this in the past. Today only with one day of symptoms; immature abscess. Last abx was clindamycin 06/20/21. Will manage with doxycycline today, which he has tolerated in the past. F/u with [place] or derm if symptoms worsen/persist. ? ?Final Clinical Impressions(s) / UC Diagnoses  ? ?Final diagnoses:  ?Infected sebaceous cyst  ? ? ? ?Discharge Instructions   ? ?  ?-Doxycycline twice daily for 7 days.  Make sure to wear sunscreen while spending time outside while on this medication as it can increase your chance of sunburn. You can take this medication with food if you have a sensitive stomach. ?-Warm compresses ?-F/u with surgery or derm if symptoms persist. Could also f/u with [place] if needed. ?-declines AVS ? ? ? ? ?ED Prescriptions   ? ? Medication Sig Dispense  Auth. Provider  ? doxycycline (VIBRAMYCIN) 100 MG capsule Take 1 capsule (100 mg total) by mouth 2 (two) times  daily for 7 days. 14 capsule Hazel Sams, PA-C  ? ?  ? ?PDMP not reviewed this encounter. ?  ?Hazel Sams, PA-C ?08/16/21 1001 ? ?

## 2021-08-24 ENCOUNTER — Ambulatory Visit: Payer: BC Managed Care – PPO | Admitting: Physician Assistant

## 2021-08-29 ENCOUNTER — Ambulatory Visit
Admission: EM | Admit: 2021-08-29 | Discharge: 2021-08-29 | Disposition: A | Discharge status: Home / Self Care | Payer: BC Managed Care – PPO | Attending: Urgent Care | Admitting: Urgent Care

## 2021-08-29 ENCOUNTER — Encounter: Payer: Self-pay | Admitting: Emergency Medicine

## 2021-08-29 ENCOUNTER — Other Ambulatory Visit: Payer: Self-pay

## 2021-08-29 DIAGNOSIS — L0291 Cutaneous abscess, unspecified: Secondary | ICD-10-CM

## 2021-08-29 MED ORDER — CLINDAMYCIN PHOSPHATE 1 % EX SOLN: CUTANEOUS | 2 refills | Status: DC

## 2021-08-29 MED ORDER — CLINDAMYCIN HCL 300 MG PO CAPS: 300.0000 mg | ORAL_CAPSULE | Freq: Four times a day (QID) | ORAL | 0 refills | Status: AC

## 2021-08-29 NOTE — ED Triage Notes (Signed)
Patient requesting antibiotics for cyst on right side of body x 4 days.  It comes and goes.  It has reappeared 4 days ago. ?

## 2021-08-29 NOTE — Discharge Instructions (Addendum)
You currently have a draining abscess to the right side of your thorax.  Because it is actively draining, we do not need to open it. Continue use of warm compresses to the area. ?I do have concern that this recurrence is due to a condition called hidradenitis suppurativa, or at bedtime for short period ?There are certain things we can do to help prevent these infections from coming, such as daily antibiotics, topical washes, or sometimes even spironolactone. ?Please follow-up with dermatology to have a work-up for this condition. ?You can purchase over-the-counter Hibiclens and use this once a week to help control the bacteria on your skin.  Stop if irritation or excessive dryness occurs. ?

## 2021-08-30 ENCOUNTER — Encounter: Payer: Self-pay | Admitting: Urgent Care

## 2021-08-30 NOTE — ED Provider Notes (Signed)
?[name] ? ? ? ?CSN: [id number] ?Arrival date & time: 08/29/21  0915 ? ? ?  ? ?History   ?Chief Complaint ?Chief Complaint  ?Patient presents with  ? Abscess  ? ? ?HPI ?Grant Ramirez is a 36 y.o. male.  ? ?Pleasant 36yo male presents with concerns of a possible abscess. He apparently has had recurrent draining skin lesions since age 39, but has had increased complications from them ssince Nov 2022. He has been seen numerous times in the past for one on his neck. He states it is still there but is not causing concern today. He reports over the past few days, a lesion on his right trunk has become painful, swollen and draining. He notes another lesion is "developing" on his L lateral abdomen near hip. He states they start out as small painless draining lesions, but turn hard and red in several days. He has a follow up with dermatology in the near future. He denies any fevers, myalgias, weakness.  ? ? ?Abscess ? ?Past Medical History:  ?Diagnosis Date  ? Asthma   ? no current inhaler use  ? Gout   ? none recent  ? Sebaceous cyst 03/25/2021  ? neck  ? ? ?There are no problems to display for this patient. ? ? ?Past Surgical History:  ?Procedure Laterality Date  ? MASS EXCISION N/A 03/27/2021  ? Procedure: EXCISION OF POSTERIOR NECK SEBACEOUS CYST;  Surgeon: Quentin Ore, MD;  Location: [facility];  Service: General;  Laterality: N/A;  ? NO PAST SURGERIES    ? ? ? ? ? ?Home Medications   ? ?Prior to Admission medications   ?Medication Sig Start Date End Date Taking? Authorizing Provider  ?clindamycin (CLEOCIN) 300 MG capsule Take 1 capsule (300 mg total) by mouth 4 (four) times daily for 10 days. 08/29/21 09/08/21 Yes Jonell Brumbaugh L, PA  ?clindamycin (CLEOCIN-T) 1 % external solution Topically scrub solution to affected areas of skin twice daily until resolved 08/29/21  Yes Bari Handshoe L, PA  ?colchicine 0.6 MG tablet Take 0.6 mg by mouth daily.  12/13/19  [provider]   ? ? ?Family History ?Family History  ?Problem Relation Age of Onset  ? Diabetes Father   ? Diabetes Paternal Grandmother   ? ? ?Social History ?Social History  ? ?Tobacco Use  ? Smoking status: Former  ?  Packs/day: 0.25  ?  Years: 15.00  ?  Pack years: 3.75  ?  Types: Cigarettes  ?  Quit date: 10/08/2020  ?  Years since quitting: 0.8  ? Smokeless tobacco: Never  ?Vaping Use  ? Vaping Use: Never used  ?Substance Use Topics  ? Alcohol use: Yes  ?  Alcohol/week: 1.0 standard drink  ?  Types: 1 Shots of liquor per week  ?  Comment: occassionally  ? Drug use: Yes  ?  Types: Marijuana  ?  Comment: marijuana smoking daily  ? ? ? ?Allergies   ?Shellfish allergy ? ? ?Review of Systems ?Review of Systems  ?Skin:  Positive for wound (several draining abscesses).  ?As per hpi ? ?Physical Exam ?Triage Vital Signs ?ED Triage Vitals [08/29/21 1033]  ?Enc Vitals Group  ?   BP (!) 147/92  ?   Pulse Rate 81  ?   Resp 18  ?   Temp 98.5 ?F (36.9 ?C)  ?   Temp Source Oral  ?   SpO2 95 %  ?   Weight (!) 350 lb  1.5 oz (158.8 kg)  ?   Height 6\' 1"  (1.854 m)  ?   Head Circumference   ?   Peak Flow   ?   Pain Score 0  ?   Pain Loc   ?   Pain Edu?   ?   Excl. in [place]?   ? ?No data found. ? ?Updated Vital Signs ?BP (!) 147/92 (BP Location: Left Arm)   Pulse 81   Temp 98.5 ?F (36.9 ?C) (Oral)   Resp 18   Ht 6\' 1"  (1.854 m)   Wt (!) 350 lb 1.5 oz (158.8 kg)   SpO2 95%   BMI 46.19 kg/m?  ? ?Visual Acuity ?Right Eye Distance:   ?Left Eye Distance:   ?Bilateral Distance:   ? ?Right Eye Near:   ?Left Eye Near:    ?Bilateral Near:    ? ?Physical Exam ?Vitals and nursing note reviewed.  ?Constitutional:   ?   General: He is not in acute distress. ?   Appearance: Normal appearance. He is obese. He is not ill-appearing, toxic-appearing or diaphoretic.  ?HENT:  ?   Head: Normocephalic and atraumatic.  ?Cardiovascular:  ?   Rate and Rhythm: Normal rate.  ?   Pulses: Normal pulses.  ?   Heart sounds: No murmur heard. ?Pulmonary:  ?   Effort: Pulmonary  effort is normal. No respiratory distress.  ?Skin: ?   General: Skin is warm.  ?   Capillary Refill: Capillary refill takes less than 2 seconds.  ?   Coloration: Skin is not jaundiced.  ?   Findings: Lesion (large, indurated, linear lesion to R side near T4 dermatome, actively draining purulent material) present. No erythema.  ?   Comments: Indurated, scarred lesion to R side posterior neck, without warmth or drainage ?Small opening to L abdomen near hip with scant drainage, no swelling or warmth, no induration  ?Neurological:  ?   General: No focal deficit present.  ?   Mental Status: He is alert.  ?Psychiatric:     ?   Mood and Affect: Mood normal.  ? ? ? ?UC Treatments / Results  ?Labs ?(all labs ordered are listed, but only abnormal results are displayed) ?Labs Reviewed - No data to display ? ?EKG ? ? ?Radiology ?No results found. ? ?Procedures ?Procedures (including critical care time) ? ?Medications Ordered in UC ?Medications - No data to display ? ?Initial Impression / Assessment and Plan / UC Course  ?I have reviewed the triage vital signs and the nursing notes. ? ?Pertinent labs & imaging results that were available during my care of the patient were reviewed by me and considered in my medical decision making (see chart for details). ? ?  ? ?Abscess - pt with an actively draining abscess on R trunk and a small developing one on L lower abdomen above hip. Clinically his lesions look similar to HS. Discussed with pt these would be odd locations for it, but given his recurrence and failure to respond to both I&D and "cyst" removal in the past, I question if this is an atypical HS. Pt has f/u scheduled with dermatology in near future. Will start Po and topical clindamycin, consider OTC hibiclens weekly as a "suppressant." Side effects of each discussed. RTC precautions reviewed. ? ?Final Clinical Impressions(s) / UC Diagnoses  ? ?Final diagnoses:  ?Abscess  ? ? ? ?Discharge Instructions   ? ?  ?You currently  have a draining abscess to the right side of your  thorax.  Because it is actively draining, we do not need to open it. Continue use of warm compresses to the area. ?I do have concern that this recurrence is due to a condition called hidradenitis suppurativa, or at bedtime for short period ?There are certain things we can do to help prevent these infections from coming, such as daily antibiotics, topical washes, or sometimes even spironolactone. ?Please follow-up with dermatology to have a work-up for this condition. ?You can purchase over-the-counter Hibiclens and use this once a week to help control the bacteria on your skin.  Stop if irritation or excessive dryness occurs. ? ? ?ED Prescriptions   ? ? Medication Sig Dispense Auth. Provider  ? clindamycin (CLEOCIN) 300 MG capsule Take 1 capsule (300 mg total) by mouth 4 (four) times daily for 10 days. 40 capsule Karlen Barbar L, PA  ? clindamycin (CLEOCIN-T) 1 % external solution Topically scrub solution to affected areas of skin twice daily until resolved 30 mL Kort Stettler L, PA  ? ?  ? ?PDMP not reviewed this encounter. ?  Maretta Bees?Junnie Loschiavo L, [place]PA ?08/30/21 2338 ? ?

## 2021-11-02 DIAGNOSIS — L0292 Furuncle, unspecified: Secondary | ICD-10-CM | POA: Diagnosis not present

## 2021-11-12 ENCOUNTER — Ambulatory Visit
Admission: EM | Admit: 2021-11-12 | Discharge: 2021-11-12 | Disposition: A | Discharge status: Home / Self Care | Payer: BC Managed Care – PPO | Attending: Family Medicine | Admitting: Family Medicine

## 2021-11-12 DIAGNOSIS — M10032 Idiopathic gout, left wrist: Secondary | ICD-10-CM | POA: Diagnosis not present

## 2021-11-12 MED ORDER — KETOROLAC TROMETHAMINE 30 MG/ML IJ SOLN
30.0000 mg | Freq: Once | INTRAMUSCULAR | Status: AC
  Administered 2021-11-12: 30 mg via INTRAMUSCULAR

## 2021-11-12 MED ORDER — COLCHICINE 0.6 MG PO TABS: 0.6000 mg | ORAL_TABLET | Freq: Every day | ORAL | 0 refills | Status: DC | PRN

## 2021-11-12 MED ORDER — PREDNISONE 20 MG PO TABS: 40.0000 mg | ORAL_TABLET | Freq: Every day | ORAL | 0 refills | Status: AC

## 2021-11-12 NOTE — ED Provider Notes (Signed)
[name]    CSN: [id number] Arrival date & time: 11/12/21  1312      History   Chief Complaint Chief Complaint  Patient presents with   Wrist Pain    HPI Grant Ramirez is a 36 y.o. male.    Wrist Pain   Here for left wrist pain that began bothering him overnight.  He noted it to be aching and now it is really hurting a lot.  No injury or trauma.  He has had similar pain in another joint when he had gout trouble a few years ago.  Past Medical History:  Diagnosis Date   Asthma    no current inhaler use   Gout    none recent   Sebaceous cyst 03/25/2021   neck    There are no problems to display for this patient.   Past Surgical History:  Procedure Laterality Date   MASS EXCISION N/A 03/27/2021   Procedure: EXCISION OF POSTERIOR NECK SEBACEOUS CYST;  Surgeon: Quentin Ore, MD;  Location: [facility];  Service: General;  Laterality: N/A;   NO PAST SURGERIES         Home Medications    Prior to Admission medications   Medication Sig Start Date End Date Taking? Authorizing Provider  colchicine 0.6 MG tablet Take 1 tablet (0.6 mg total) by mouth daily as needed (gout pain). 11/12/21  Yes Teondre Jarosz, Janace Aris, MD  predniSONE (DELTASONE) 20 MG tablet Take 2 tablets (40 mg total) by mouth daily with breakfast for 5 days. 11/12/21 11/17/21 Yes Zenia Resides, MD  clindamycin (CLEOCIN-T) 1 % external solution Topically scrub solution to affected areas of skin twice daily until resolved 08/29/21   Maretta Bees, PA    Family History Family History  Problem Relation Age of Onset   Diabetes Father    Diabetes Paternal Grandmother     Social History Social History   Tobacco Use   Smoking status: Former    Packs/day: 0.25    Years: 15.00    Total pack years: 3.75    Types: Cigarettes    Quit date: 10/08/2020    Years since quitting: 1.0   Smokeless tobacco: Never  Vaping Use   Vaping Use: Never used  Substance Use  Topics   Alcohol use: Yes    Alcohol/week: 1.0 standard drink of alcohol    Types: 1 Shots of liquor per week    Comment: occassionally   Drug use: Yes    Types: Marijuana    Comment: marijuana smoking daily     Allergies   Shellfish allergy   Review of Systems Review of Systems   Physical Exam Triage Vital Signs ED Triage Vitals [11/12/21 1358]  Enc Vitals Group     BP 121/76     Pulse Rate 73     Resp 18     Temp 98.6 F (37 C)     Temp Source Oral     SpO2 97 %     Weight      Height      Head Circumference      Peak Flow      Pain Score 9     Pain Loc      Pain Edu?      Excl. in [place]?    No data found.  Updated Vital Signs BP 121/76 (BP Location: Left Arm)   Pulse 73   Temp 98.6 F (37 C) (Oral)   Resp  18   SpO2 97%   Visual Acuity Right Eye Distance:   Left Eye Distance:   Bilateral Distance:    Right Eye Near:   Left Eye Near:    Bilateral Near:     Physical Exam Vitals reviewed.  Constitutional:      General: He is not in acute distress.    Appearance: He is not toxic-appearing.  Cardiovascular:     Rate and Rhythm: Normal rate and regular rhythm.  Musculoskeletal:     Comments: Swelling and tenderness over his left wrist on the dorsum.  Skin:    Coloration: Skin is not pale.  Neurological:     General: No focal deficit present.     Mental Status: He is alert and oriented to person, place, and time.      UC Treatments / Results  Labs (all labs ordered are listed, but only abnormal results are displayed) Labs Reviewed - No data to display  EKG   Radiology No results found.  Procedures Procedures (including critical care time)  Medications Ordered in UC Medications  ketorolac (TORADOL) 30 MG/ML injection 30 mg (30 mg Intramuscular Given 11/12/21 1503)    Initial Impression / Assessment and Plan / UC Course  I have reviewed the triage vital signs and the nursing notes.  Pertinent labs & imaging results that were  available during my care of the patient were reviewed by me and considered in my medical decision making (see chart for details).     You feel he most likely has a gout flare. Final Clinical Impressions(s) / UC Diagnoses   Final diagnoses:  Acute idiopathic gout of left wrist     Discharge Instructions      You have been given a shot of Toradol 30 mg today.  Prednisone 20 mg--take 2 daily for 5 days.  This is for inflammation in your wrist  Take colchicine 0.6 mg--1 daily as needed for gout pain.    ED Prescriptions     Medication Sig Dispense Auth. Provider   predniSONE (DELTASONE) 20 MG tablet Take 2 tablets (40 mg total) by mouth daily with breakfast for 5 days. 10 tablet Zenia Resides, MD   colchicine 0.6 MG tablet Take 1 tablet (0.6 mg total) by mouth daily as needed (gout pain). 30 tablet Zayvion Stailey, Janace Aris, MD      PDMP not reviewed this encounter.   Zenia Resides, MD 11/12/21 1728

## 2021-11-12 NOTE — Discharge Instructions (Addendum)
You have been given a shot of Toradol 30 mg today.  Prednisone 20 mg--take 2 daily for 5 days.  This is for inflammation in your wrist  Take colchicine 0.6 mg--1 daily as needed for gout pain.

## 2021-11-12 NOTE — ED Triage Notes (Signed)
Pt present left wrist pain, symptoms started last night around 1 am. Pt states having trouble with some movement.

## 2021-12-04 ENCOUNTER — Ambulatory Visit: Payer: BC Managed Care – PPO

## 2021-12-04 ENCOUNTER — Ambulatory Visit (HOSPITAL_BASED_OUTPATIENT_CLINIC_OR_DEPARTMENT_OTHER)
Admission: RE | Admit: 2021-12-04 | Discharge: 2021-12-04 | Disposition: A | Discharge status: Home / Self Care | Payer: BC Managed Care – PPO | Source: Ambulatory Visit | Attending: Internal Medicine | Admitting: Internal Medicine

## 2021-12-04 ENCOUNTER — Ambulatory Visit
Admission: EM | Admit: 2021-12-04 | Discharge: 2021-12-04 | Disposition: A | Discharge status: Home / Self Care | Payer: BC Managed Care – PPO | Attending: Internal Medicine | Admitting: Internal Medicine

## 2021-12-04 DIAGNOSIS — M79672 Pain in left foot: Secondary | ICD-10-CM | POA: Diagnosis not present

## 2021-12-04 DIAGNOSIS — M7732 Calcaneal spur, left foot: Secondary | ICD-10-CM | POA: Diagnosis not present

## 2021-12-04 DIAGNOSIS — M2012 Hallux valgus (acquired), left foot: Secondary | ICD-10-CM | POA: Diagnosis not present

## 2021-12-04 DIAGNOSIS — S99922A Unspecified injury of left foot, initial encounter: Secondary | ICD-10-CM

## 2021-12-04 DIAGNOSIS — L02416 Cutaneous abscess of left lower limb: Secondary | ICD-10-CM | POA: Diagnosis not present

## 2021-12-04 DIAGNOSIS — M7989 Other specified soft tissue disorders: Secondary | ICD-10-CM | POA: Diagnosis not present

## 2021-12-04 DIAGNOSIS — M19072 Primary osteoarthritis, left ankle and foot: Secondary | ICD-10-CM | POA: Diagnosis not present

## 2021-12-04 MED ORDER — MUPIROCIN 2 % EX OINT: 1.0000 | TOPICAL_OINTMENT | Freq: Two times a day (BID) | CUTANEOUS | 0 refills | Status: DC

## 2021-12-04 MED ORDER — SULFAMETHOXAZOLE-TRIMETHOPRIM 800-160 MG PO TABS: 1.0000 | ORAL_TABLET | Freq: Two times a day (BID) | ORAL | 0 refills | Status: AC

## 2021-12-04 NOTE — ED Provider Notes (Signed)
[name]    CSN: [id number] Arrival date & time: 12/04/21  1505      History   Chief Complaint Chief Complaint  Patient presents with   problems x2    HPI Grant Ramirez is a 36 y.o. male.   Patient presents today with several concerns.  His primary concern today is a weeklong history of enlarging lesion in his left medial leg.  He has a history of recurrent abscesses with similar presentation.  Pain is rated 9 on a 0-10 pain scale, described as throbbing, no aggravating relieving factors identified.  Denies history of MRSA.  Denies history of diabetes or immunosuppression.  He was treated several weeks ago with doxycycline but denies additional antibiotics since that time.  He has been applying warm compresses but this has not provided relief of symptoms.  In addition, patient reports that he injured his left foot earlier today.  He dropped something very heavy on his foot.  He has had ongoing pain since that time.  Pain is rated 10 on a 0-10 pain scale, described as aching, localized to first metatarsal, no alleviating factors identified.  He denies any numbness, paresthesias, inability to ambulate.  He has not tried any over-the-counter medication for symptom management.  Denies history of previous injury.    Past Medical History:  Diagnosis Date   Asthma    no current inhaler use   Gout    none recent   Sebaceous cyst 03/25/2021   neck    There are no problems to display for this patient.   Past Surgical History:  Procedure Laterality Date   MASS EXCISION N/A 03/27/2021   Procedure: EXCISION OF POSTERIOR NECK SEBACEOUS CYST;  Surgeon: Quentin Ore, MD;  Location: [facility];  Service: General;  Laterality: N/A;   NO PAST SURGERIES         Home Medications    Prior to Admission medications   Medication Sig Start Date End Date Taking? Authorizing Provider  mupirocin ointment (BACTROBAN) 2 % Apply 1 Application topically 2  (two) times daily. 12/04/21  Yes Zamier Eggebrecht K, PA-C  sulfamethoxazole-trimethoprim (BACTRIM DS) 800-160 MG tablet Take 1 tablet by mouth 2 (two) times daily for 7 days. 12/04/21 12/11/21 Yes Zulay Corrie K, PA-C  colchicine 0.6 MG tablet Take 1 tablet (0.6 mg total) by mouth daily as needed (gout pain). 11/12/21   Zenia Resides, MD    Family History Family History  Problem Relation Age of Onset   Diabetes Father    Diabetes Paternal Grandmother     Social History Social History   Tobacco Use   Smoking status: Former    Packs/day: 0.25    Years: 15.00    Total pack years: 3.75    Types: Cigarettes    Quit date: 10/08/2020    Years since quitting: 1.1   Smokeless tobacco: Never  Vaping Use   Vaping Use: Never used  Substance Use Topics   Alcohol use: Yes    Alcohol/week: 1.0 standard drink of alcohol    Types: 1 Shots of liquor per week    Comment: occassionally   Drug use: Yes    Types: Marijuana    Comment: marijuana smoking daily     Allergies   Shellfish allergy   Review of Systems Review of Systems  Constitutional:  Positive for activity change. Negative for appetite change, fatigue and fever.  Respiratory:  Negative for cough and shortness of breath.   Cardiovascular:  Negative for chest pain.  Gastrointestinal:  Negative for abdominal pain, diarrhea, nausea and vomiting.  Musculoskeletal:  Positive for arthralgias and joint swelling. Negative for gait problem and myalgias.  Skin:  Positive for wound.     Physical Exam Triage Vital Signs ED Triage Vitals [12/04/21 1516]  Enc Vitals Group     BP 128/63     Pulse Rate 97     Resp 18     Temp 98 F (36.7 C)     Temp Source Oral     SpO2 96 %     Weight      Height      Head Circumference      Peak Flow      Pain Score 0     Pain Loc      Pain Edu?      Excl. in GC?    No data found.  Updated Vital Signs BP 128/63 (BP Location: Left Arm)   Pulse 97   Temp 98 F (36.7 C) (Oral)   Resp 18    SpO2 96%   Visual Acuity Right Eye Distance:   Left Eye Distance:   Bilateral Distance:    Right Eye Near:   Left Eye Near:    Bilateral Near:     Physical Exam Vitals reviewed.  Constitutional:      General: He is awake.     Appearance: Normal appearance. He is well-developed. He is not ill-appearing.     Comments: Very pleasant male appears stated age in no acute distress sitting comfortably in exam room  HENT:     Head: Normocephalic and atraumatic.  Cardiovascular:     Rate and Rhythm: Normal rate and regular rhythm.     Heart sounds: Normal heart sounds, S1 normal and S2 normal. No murmur heard.    Comments: Capillary refill within 2 seconds left toes Pulmonary:     Effort: Pulmonary effort is normal.     Breath sounds: Normal breath sounds. No stridor. No wheezing, rhonchi or rales.     Comments: Clear to auscultation bilaterally Musculoskeletal:     Left foot: Decreased range of motion. Normal capillary refill. Swelling, tenderness and bony tenderness present.     Comments: Left foot: Swelling and tenderness palpation everywhere first metatarsal.  No deformity noted.  Normal active range of motion of phalanges.  No pain or tenderness of ankle.  Foot neurovascularly intact.  Skin:    Findings: Abscess present.     Comments: 3 cm x 2 cm abscess noted medial left thigh with central fluctuance.  Neurological:     Mental Status: He is alert.  Psychiatric:        Behavior: Behavior is cooperative.      UC Treatments / Results  Labs (all labs ordered are listed, but only abnormal results are displayed) Labs Reviewed - No data to display  EKG   Radiology No results found.  Procedures Incision and Drainage  Date/Time: 12/04/2021 3:56 PM  Performed by: Jeani Hawking, PA-C Authorized by: Jeani Hawking, PA-C   Consent:    Consent obtained:  Verbal   Consent given by:  Patient   Risks discussed:  Bleeding, incomplete drainage and infection   Alternatives  discussed:  Referral Universal protocol:    Procedure explained and questions answered to patient or proxy's satisfaction: yes     Patient identity confirmed:  Verbally with patient Location:    Type:  Abscess   Size:  3 cm  x 2 cm   Location:  Lower extremity   Lower extremity location:  Leg   Leg location:  L upper leg Pre-procedure details:    Skin preparation:  Chlorhexidine with alcohol Sedation:    Sedation type:  None Anesthesia:    Anesthesia method:  Local infiltration   Local anesthetic:  Lidocaine 1% w/o epi Procedure type:    Complexity:  Complex Procedure details:    Ultrasound guidance: no     Needle aspiration: no     Incision types:  Single straight   Incision depth:  Dermal   Wound management:  Probed and deloculated and irrigated with saline   Drainage:  Bloody and purulent   Drainage amount:  Moderate   Wound treatment:  Wound left open   Packing materials:  None Post-procedure details:    Procedure completion:  Tolerated well, no immediate complications  (including critical care time)  Medications Ordered in UC Medications - No data to display  Initial Impression / Assessment and Plan / UC Course  I have reviewed the triage vital signs and the nursing notes.  Pertinent labs & imaging results that were available during my care of the patient were reviewed by me and considered in my medical decision making (see chart for details).     Abscess was drained in clinic successfully with improvement of symptoms.  Patient was started on Bactrim DS twice daily for 7 days.  Discussed that he needs to keep area clean and apply Bactroban ointment twice daily with dressing changes.  If he develops any side effects of the medication including rash or oral lesions he is to stop the medication to be seen.  Strict return precautions given to which he expressed understanding.  Given mechanism of injury we will order plain films.  Unfortunately, we were having technical  difficulties with our x-ray machine.  Patient was sent for outpatient imaging.  He was prophylactically placed in a postop shoe for comfort and support.  Discussed that he should keep the area elevated and use [employer] for symptom relief.  Recommended follow-up with podiatry and he was given contact information for local provider.  We will contact him with abnormal x-ray results if we need to change treatment plan.  Discussed that if he has any worsening symptoms needs to be seen immediately.  Strict return precautions given.   Final Clinical Impressions(s) / UC Diagnoses   Final diagnoses:  Abscess of left leg  Foot injury, left, initial encounter  Left foot pain     Discharge Instructions      Go to [facility] for outpatient imaging.  I will contact you with your results.  Use the boot for comfort.  Keep your foot elevated and use ice as well as over-the-counter medications for pain relief.  Follow-up with foot specialist if symptoms not proving quickly.  Call to schedule an appointment.  If anything worsens return for reevaluation.  We drained your abscess.  Please start antibiotics.  If you develop any oral lesions or rash stop the medication to be seen.  This will continue to drain on its own.  Keep it clean and apply Bactroban ointment twice daily.  If anything worsens and starts feeling of fluid, becomes painful, fever, nausea, vomiting need to be seen immediately.  [facility] [address] [place], [place] [address] [phone]      ED Prescriptions     Medication Sig Dispense Auth. Provider   sulfamethoxazole-trimethoprim (BACTRIM DS)  800-160 MG tablet Take 1 tablet by mouth 2 (two) times daily for 7 days. 14 tablet Nelta Caudill K, PA-C   mupirocin ointment (BACTROBAN) 2 % Apply 1 Application topically 2 (two) times daily. 22 g Ayad Nieman K, PA-C      PDMP not reviewed this encounter.   Jeani Hawking, PA-C 12/04/21 1628

## 2021-12-04 NOTE — ED Triage Notes (Signed)
Pt c/o abscess to left groin area. States it has not drained but is now uncomfortable. Onset ~ 1 week ago.   Also pt dropped 300lbs glass object on left foot which is now edematous.

## 2021-12-04 NOTE — Discharge Instructions (Addendum)
Go to [facility] for outpatient imaging.  I will contact you with your results.  Use the boot for comfort.  Keep your foot elevated and use ice as well as over-the-counter medications for pain relief.  Follow-up with foot specialist if symptoms not proving quickly.  Call to schedule an appointment.  If anything worsens return for reevaluation.  We drained your abscess.  Please start antibiotics.  If you develop any oral lesions or rash stop the medication to be seen.  This will continue to drain on its own.  Keep it clean and apply Bactroban ointment twice daily.  If anything worsens and starts feeling of fluid, becomes painful, fever, nausea, vomiting need to be seen immediately.  [facility] [address] [place], [place] [address] [phone]

## 2021-12-06 ENCOUNTER — Encounter (HOSPITAL_COMMUNITY): Payer: Self-pay | Admitting: Physician Assistant

## 2021-12-14 ENCOUNTER — Ambulatory Visit: Payer: BC Managed Care – PPO | Admitting: Internal Medicine

## 2022-01-10 ENCOUNTER — Ambulatory Visit
Admission: EM | Admit: 2022-01-10 | Discharge: 2022-01-10 | Disposition: A | Discharge status: Home / Self Care | Payer: BC Managed Care – PPO | Attending: Physician Assistant | Admitting: Physician Assistant

## 2022-01-10 ENCOUNTER — Other Ambulatory Visit: Payer: Self-pay

## 2022-01-10 ENCOUNTER — Encounter: Payer: Self-pay | Admitting: Emergency Medicine

## 2022-01-10 DIAGNOSIS — M109 Gout, unspecified: Secondary | ICD-10-CM | POA: Diagnosis not present

## 2022-01-10 MED ORDER — PREDNISONE 20 MG PO TABS: 40.0000 mg | ORAL_TABLET | Freq: Every day | ORAL | 0 refills | Status: AC

## 2022-01-10 NOTE — ED Provider Notes (Signed)
[name]    CSN: [id number] Arrival date & time: 01/10/22  5631      History   Chief Complaint Chief Complaint  Patient presents with   Hand Pain    HPI Grant Ramirez is a 36 y.o. male.   Patient here today for evaluation of left hand pain and pain in left wrist that started 2 days ago. He has not had any injury. Movement worsens pain. He does have history of gout with similar presentation. He notes he tried to take colchicine without improvement.   The history is provided by the patient.    Past Medical History:  Diagnosis Date   Asthma    no current inhaler use   Gout    none recent   Sebaceous cyst 03/25/2021   neck    There are no problems to display for this patient.   Past Surgical History:  Procedure Laterality Date   MASS EXCISION N/A 03/27/2021   Procedure: EXCISION OF POSTERIOR NECK SEBACEOUS CYST;  Surgeon: Quentin Ore, MD;  Location: [facility];  Service: General;  Laterality: N/A;   NO PAST SURGERIES         Home Medications    Prior to Admission medications   Medication Sig Start Date End Date Taking? Authorizing Provider  predniSONE (DELTASONE) 20 MG tablet Take 2 tablets (40 mg total) by mouth daily with breakfast for 5 days. 01/10/22 01/15/22 Yes Tomi Bamberger, PA-C  colchicine 0.6 MG tablet Take 1 tablet (0.6 mg total) by mouth daily as needed (gout pain). 11/12/21   Zenia Resides, MD  mupirocin ointment (BACTROBAN) 2 % Apply 1 Application topically 2 (two) times daily. 12/04/21   Raspet, Noberto Retort, PA-C    Family History Family History  Problem Relation Age of Onset   Diabetes Father    Diabetes Paternal Grandmother     Social History Social History   Tobacco Use   Smoking status: Former    Packs/day: 0.25    Years: 15.00    Total pack years: 3.75    Types: Cigarettes    Quit date: 10/08/2020    Years since quitting: 1.2   Smokeless tobacco: Never  Vaping Use   Vaping Use: Never used   Substance Use Topics   Alcohol use: Yes    Alcohol/week: 1.0 standard drink of alcohol    Types: 1 Shots of liquor per week    Comment: occassionally   Drug use: Yes    Types: Marijuana    Comment: marijuana smoking daily     Allergies   Shellfish allergy   Review of Systems Review of Systems  Constitutional:  Negative for chills and fever.  Eyes:  Negative for discharge and redness.  Musculoskeletal:  Positive for arthralgias and joint swelling.  Skin:  Negative for color change and wound.  Neurological:  Negative for numbness.     Physical Exam Triage Vital Signs ED Triage Vitals  Enc Vitals Group     BP      Pulse      Resp      Temp      Temp src      SpO2      Weight      Height      Head Circumference      Peak Flow      Pain Score      Pain Loc      Pain Edu?  Excl. in GC?    No data found.  Updated Vital Signs BP (!) 146/91 (BP Location: Right Arm)   Pulse 73   Temp 98.6 F (37 C) (Oral)   Resp 15   SpO2 96%   Physical Exam Vitals and nursing note reviewed.  Constitutional:      General: He is not in acute distress.    Appearance: Normal appearance. He is not ill-appearing.  HENT:     Head: Normocephalic and atraumatic.  Eyes:     Conjunctiva/sclera: Conjunctivae normal.  Cardiovascular:     Rate and Rhythm: Normal rate.  Pulmonary:     Effort: Pulmonary effort is normal.  Musculoskeletal:     Comments: Decreased ROM of left wrist and fingers due to pain, mild swelling to left hand diffuse  Skin:    General: Skin is warm and dry.  Neurological:     Mental Status: He is alert.     Comments: Gross sensation intact to left fingers  Psychiatric:        Mood and Affect: Mood normal.        Behavior: Behavior normal.        Thought Content: Thought content normal.      UC Treatments / Results  Labs (all labs ordered are listed, but only abnormal results are displayed) Labs Reviewed - No data to  display  EKG   Radiology No results found.  Procedures Procedures (including critical care time)  Medications Ordered in UC Medications - No data to display  Initial Impression / Assessment and Plan / UC Course  I have reviewed the triage vital signs and the nursing notes.  Pertinent labs & imaging results that were available during my care of the patient were reviewed by me and considered in my medical decision making (see chart for details).    Will treat with prednisone for suspected gout flare given lack of improvement with colchicine. Encouraged follow up if no gradual improvement or with any further concerns.   Final Clinical Impressions(s) / UC Diagnoses   Final diagnoses:  Acute gout of left hand, unspecified cause   Discharge Instructions   None    ED Prescriptions     Medication Sig Dispense Auth. Provider   predniSONE (DELTASONE) 20 MG tablet Take 2 tablets (40 mg total) by mouth daily with breakfast for 5 days. 10 tablet Tomi Bamberger, PA-C      PDMP not reviewed this encounter.   Tomi Bamberger, PA-C 01/10/22 1313

## 2022-01-10 NOTE — ED Triage Notes (Signed)
Pt here for left hand pain in wrist and thumb x 2 days; denies injury

## 2022-01-20 ENCOUNTER — Ambulatory Visit
Admission: EM | Admit: 2022-01-20 | Discharge: 2022-01-20 | Disposition: A | Discharge status: Home / Self Care | Payer: BC Managed Care – PPO | Attending: Internal Medicine | Admitting: Internal Medicine

## 2022-01-20 ENCOUNTER — Encounter: Payer: Self-pay | Admitting: Emergency Medicine

## 2022-01-20 DIAGNOSIS — M25532 Pain in left wrist: Secondary | ICD-10-CM | POA: Diagnosis not present

## 2022-01-20 MED ORDER — PREDNISONE 10 MG (21) PO TBPK: ORAL_TABLET | Freq: Every day | ORAL | 0 refills | Status: DC

## 2022-01-20 NOTE — Discharge Instructions (Signed)
I have prescribed you another course of prednisone as I think it would be beneficial.  Please follow-up with orthopedist or primary care doctor if your symptoms persist or worsen.

## 2022-01-20 NOTE — ED Triage Notes (Signed)
Pt is present today with recurrent left hand pain. Pt states that the medication that he was prescribed has not been helping.

## 2022-01-20 NOTE — ED Provider Notes (Signed)
[name]    CSN: [id number] Arrival date & time: 01/20/22  1454      History   Chief Complaint Chief Complaint  Patient presents with   Hand Pain    HPI Grant Ramirez is a 36 y.o. male.   Patient presents with persistent left wrist pain after being seen on 01/10/2022.  Patient has a history of recurrent gout.  He had been self treating with colchicine prior to urgent care visit on 9/3.  He was prescribed prednisone for 5 days which he has completed.  He reports some improvement but not complete resolution with prednisone.  Denies any numbness or tingling.  Pain only radiates with movement of extremity.  Denies any obvious injury to the area.  Patient states that he had eaten some tilapia prior to symptoms starting.   Hand Pain    Past Medical History:  Diagnosis Date   Asthma    no current inhaler use   Gout    none recent   Sebaceous cyst 03/25/2021   neck    There are no problems to display for this patient.   Past Surgical History:  Procedure Laterality Date   MASS EXCISION N/A 03/27/2021   Procedure: EXCISION OF POSTERIOR NECK SEBACEOUS CYST;  Surgeon: Quentin Ore, MD;  Location: [facility];  Service: General;  Laterality: N/A;   NO PAST SURGERIES         Home Medications    Prior to Admission medications   Medication Sig Start Date End Date Taking? Authorizing Provider  predniSONE (STERAPRED UNI-PAK 21 TAB) 10 MG (21) TBPK tablet Take by mouth daily. Take 6 tabs by mouth daily  for 2 days, then 5 tabs for 2 days, then 4 tabs for 2 days, then 3 tabs for 2 days, 2 tabs for 2 days, then 1 tab by mouth daily for 2 days 01/20/22  Yes [place], Funkley E, FNP  colchicine 0.6 MG tablet Take 1 tablet (0.6 mg total) by mouth daily as needed (gout pain). 11/12/21   Zenia Resides, MD  mupirocin ointment (BACTROBAN) 2 % Apply 1 Application topically 2 (two) times daily. 12/04/21   Raspet, Noberto Retort, PA-C    Family History Family  History  Problem Relation Age of Onset   Diabetes Father    Diabetes Paternal Grandmother     Social History Social History   Tobacco Use   Smoking status: Former    Packs/day: 0.25    Years: 15.00    Total pack years: 3.75    Types: Cigarettes    Quit date: 10/08/2020    Years since quitting: 1.2   Smokeless tobacco: Never  Vaping Use   Vaping Use: Never used  Substance Use Topics   Alcohol use: Yes    Alcohol/week: 1.0 standard drink of alcohol    Types: 1 Shots of liquor per week    Comment: occassionally   Drug use: Yes    Types: Marijuana    Comment: marijuana smoking daily     Allergies   Shellfish allergy   Review of Systems Review of Systems Per HPI  Physical Exam Triage Vital Signs ED Triage Vitals [01/20/22 1546]  Enc Vitals Group     BP 132/84     Pulse Rate 84     Resp 18     Temp 98.3 F (36.8 C)     Temp src      SpO2 95 %     Weight  Height      Head Circumference      Peak Flow      Pain Score 5     Pain Loc      Pain Edu?      Excl. in GC?    No data found.  Updated Vital Signs BP 132/84   Pulse 84   Temp 98.3 F (36.8 C)   Resp 18   SpO2 95%   Visual Acuity Right Eye Distance:   Left Eye Distance:   Bilateral Distance:    Right Eye Near:   Left Eye Near:    Bilateral Near:     Physical Exam Constitutional:      General: He is not in acute distress.    Appearance: Normal appearance. He is not toxic-appearing or diaphoretic.  HENT:     Head: Normocephalic and atraumatic.  Eyes:     Extraocular Movements: Extraocular movements intact.     Conjunctiva/sclera: Conjunctivae normal.  Pulmonary:     Effort: Pulmonary effort is normal.  Musculoskeletal:     Comments: Tenderness to palpation throughout left wrist.  No tenderness to hand or fingers.  Grip strength 5/5.  Neurovascular intact.  No obvious discoloration, warmth, swelling, lacerations, abrasions noted.  Neurological:     General: No focal deficit  present.     Mental Status: He is alert and oriented to person, place, and time. Mental status is at baseline.  Psychiatric:        Mood and Affect: Mood normal.        Behavior: Behavior normal.        Thought Content: Thought content normal.        Judgment: Judgment normal.      UC Treatments / Results  Labs (all labs ordered are listed, but only abnormal results are displayed) Labs Reviewed - No data to display  EKG   Radiology No results found.  Procedures Procedures (including critical care time)  Medications Ordered in UC Medications - No data to display  Initial Impression / Assessment and Plan / UC Course  I have reviewed the triage vital signs and the nursing notes.  Pertinent labs & imaging results that were available during my care of the patient were reviewed by me and considered in my medical decision making (see chart for details).     Physical exam is consistent with gout especially given patient's history.  Do not think imaging is necessary given no obvious injury or direct bony tenderness.  I do think patient would benefit from a longer course and higher dose of prednisone given best evidence.  Advised patient to follow-up with orthopedist at provided contact information if symptoms persist or worsen.  Discussed return precautions.  Patient verbalized understanding and was agreeable with plan. Final Clinical Impressions(s) / UC Diagnoses   Final diagnoses:  Left wrist pain     Discharge Instructions      I have prescribed you another course of prednisone as I think it would be beneficial.  Please follow-up with orthopedist or primary care doctor if your symptoms persist or worsen.    ED Prescriptions     Medication Sig Dispense Auth. Provider   predniSONE (STERAPRED UNI-PAK 21 TAB) 10 MG (21) TBPK tablet Take by mouth daily. Take 6 tabs by mouth daily  for 2 days, then 5 tabs for 2 days, then 4 tabs for 2 days, then 3 tabs for 2 days, 2 tabs for  2 days, then 1 tab by mouth  daily for 2 days 42 tablet Edison, Acie Fredrickson, [place]      PDMP not reviewed this encounter.   Gustavus Bryant, [place] 01/20/22 1558

## 2022-01-30 ENCOUNTER — Ambulatory Visit (HOSPITAL_COMMUNITY)
Admission: EM | Admit: 2022-01-30 | Discharge: 2022-01-30 | Disposition: A | Discharge status: Home / Self Care | Payer: BC Managed Care – PPO | Attending: Emergency Medicine | Admitting: Emergency Medicine

## 2022-01-30 ENCOUNTER — Ambulatory Visit (INDEPENDENT_AMBULATORY_CARE_PROVIDER_SITE_OTHER): Payer: BC Managed Care – PPO

## 2022-01-30 ENCOUNTER — Encounter (HOSPITAL_COMMUNITY): Payer: Self-pay

## 2022-01-30 DIAGNOSIS — M25532 Pain in left wrist: Secondary | ICD-10-CM

## 2022-01-30 DIAGNOSIS — M25432 Effusion, left wrist: Secondary | ICD-10-CM | POA: Diagnosis not present

## 2022-01-30 DIAGNOSIS — M7989 Other specified soft tissue disorders: Secondary | ICD-10-CM | POA: Diagnosis not present

## 2022-01-30 MED ORDER — IBUPROFEN 800 MG PO TABS
800.0000 mg | ORAL_TABLET | Freq: Once | ORAL | Status: AC
  Administered 2022-01-30: 800 mg via ORAL

## 2022-01-30 MED ORDER — IBUPROFEN 800 MG PO TABS
ORAL_TABLET | ORAL | Status: AC
  Filled 2022-01-30: qty 1

## 2022-01-30 NOTE — Discharge Instructions (Addendum)
Rest - try to avoid heavy lifting and high impact activity Ice - apply for 20 minutes a few times daily Compression - use a wrist sleeve as needed, during the day and at night Elevation - prop up on a pillow  I recommend ibuprofen, up to 800 mg every 6 hours for pain and inflammation. Please follow up with the orthopedic specialists if symptoms persist.

## 2022-01-30 NOTE — ED Notes (Signed)
Verbal consent given by patient to release right side wrist brace.

## 2022-01-30 NOTE — ED Triage Notes (Signed)
Pt presents with recurrent left hand pain. Pt states the prednisone help his hand but his is out of refills.

## 2022-01-30 NOTE — ED Provider Notes (Signed)
[place]    CSN: [id number] Arrival date & time: 01/30/22  1713      History   Chief Complaint Chief Complaint  Patient presents with   Hand Problem    HPI Grant Ramirez is a 36 y.o. male.  Presents with left wrist pain, swelling He is left handed, does a lot of lifting/moving but otherwise no known trauma or injury Symptoms involve the wrist, sometimes with tingling into the fingers. About 5/10 today, but reports more painful last night  Tried ice  Was seen 9/3 and thought it was gout, took short course prednisone that seemed to improve symptoms, but they returned. Seen again 9/13, given longer course of prednisone. He usually does well with the prednisone for his gout but this time no improvement.  Past Medical History:  Diagnosis Date   Asthma    no current inhaler use   Gout    none recent   Sebaceous cyst 03/25/2021   neck    There are no problems to display for this patient.   Past Surgical History:  Procedure Laterality Date   MASS EXCISION N/A 03/27/2021   Procedure: EXCISION OF POSTERIOR NECK SEBACEOUS CYST;  Surgeon: Felicie Morn, MD;  Location: [place];  Service: General;  Laterality: N/A;   NO PAST SURGERIES       Home Medications    Prior to Admission medications   Medication Sig Start Date End Date Taking? Authorizing Provider  colchicine 0.6 MG tablet Take 1 tablet (0.6 mg total) by mouth daily as needed (gout pain). 11/12/21   Barrett Henle, MD  mupirocin ointment (BACTROBAN) 2 % Apply 1 Application topically 2 (two) times daily. 12/04/21   Raspet, Derry Skill, PA-C  predniSONE (STERAPRED UNI-PAK 21 TAB) 10 MG (21) TBPK tablet Take by mouth daily. Take 6 tabs by mouth daily  for 2 days, then 5 tabs for 2 days, then 4 tabs for 2 days, then 3 tabs for 2 days, 2 tabs for 2 days, then 1 tab by mouth daily for 2 days 01/20/22   Teodora Medici, FNP    Family History Family History  Problem Relation Age of  Onset   Diabetes Father    Diabetes Paternal Grandmother     Social History Social History   Tobacco Use   Smoking status: Former    Packs/day: 0.25    Years: 15.00    Total pack years: 3.75    Types: Cigarettes    Quit date: 10/08/2020    Years since quitting: 1.3   Smokeless tobacco: Never  Vaping Use   Vaping Use: Never used  Substance Use Topics   Alcohol use: Yes    Alcohol/week: 1.0 standard drink of alcohol    Types: 1 Shots of liquor per week    Comment: occassionally   Drug use: Yes    Types: Marijuana    Comment: marijuana smoking daily     Allergies   Shellfish allergy   Review of Systems Review of Systems Per HPI  Physical Exam Triage Vital Signs ED Triage Vitals  Enc Vitals Group     BP 01/30/22 1806 (!) 153/90     Pulse Rate 01/30/22 1805 66     Resp --      Temp 01/30/22 1805 97.9 F (36.6 C)     Temp Source 01/30/22 1805 Oral     SpO2 01/30/22 1805 94 %     Weight --  Height --      Head Circumference --      Peak Flow --      Pain Score --      Pain Loc --      Pain Edu? --      Excl. in GC? --    No data found.  Updated Vital Signs BP (!) 153/90 (BP Location: Left Arm)   Pulse 66   Temp 97.9 F (36.6 C) (Oral)   SpO2 94%    Physical Exam Vitals and nursing note reviewed.  Constitutional:      General: He is not in acute distress. Cardiovascular:     Rate and Rhythm: Normal rate and regular rhythm.     Pulses: Normal pulses.  Pulmonary:     Effort: Pulmonary effort is normal.  Musculoskeletal:     Right wrist: Normal.     Left wrist: Swelling present. Decreased range of motion.     Left hand: Normal.     Comments: Left wrist decreased ROM, especially extension. Swelling noted. Wrist a little tender to palpation. Distal sensation intact, cap refill < 2 seconds. No pain in the fingers, hand, forearm.   Skin:    General: Skin is warm and dry.     Capillary Refill: Capillary refill takes less than 2 seconds.      Findings: No erythema or rash.  Neurological:     Mental Status: He is alert and oriented to person, place, and time.     UC Treatments / Results  Labs (all labs ordered are listed, but only abnormal results are displayed) Labs Reviewed - No data to display  EKG  Radiology DG Wrist Complete Left  Result Date: 01/30/2022 CLINICAL DATA:  Swelling and pain.  Decreased range of motion. EXAM: LEFT WRIST - COMPLETE 3+ VIEW COMPARISON:  None Available. FINDINGS: There is no evidence of fracture or dislocation. There is no evidence of arthropathy or other focal bone abnormality. Soft tissues are unremarkable. IMPRESSION: Negative. Electronically Signed   By: Marin Roberts M.D.   On: 01/30/2022 18:50    Procedures Procedures (including critical care time)  Medications Ordered in UC Medications  ibuprofen (ADVIL) tablet 800 mg (800 mg Oral Given 01/30/22 1834)    Initial Impression / Assessment and Plan / UC Course  I have reviewed the triage vital signs and the nursing notes.  Pertinent labs & imaging results that were available during my care of the patient were reviewed by me and considered in my medical decision making (see chart for details).  Not likely gout given swelling and stiffness of the wrist, patient is able to tolerate full exam with very little tenderness to palpation. Wrist is not warm or red.  Could be soft tissue injury/swelling from overuse, given occupation.  Ibuprofen dose given for inflammation, pain control Reports pain improved to 3/10   Left wrist xray negative. Applied wrist brace for support, compression  Recommend ibu q6 hours PRN, ice, wrist brace. Follow up with orthopedic specialists. Contact information given. Return precautions discussed. Patient agrees to plan  Final Clinical Impressions(s) / UC Diagnoses   Final diagnoses:  Pain and swelling of left wrist     Discharge Instructions      Rest - try to avoid heavy lifting and high  impact activity Ice - apply for 20 minutes a few times daily Compression - use a wrist sleeve as needed, during the day and at night Elevation - prop up on a pillow  I  recommend ibuprofen, up to 800 mg every 6 hours for pain and inflammation. Please follow up with the orthopedic specialists if symptoms persist.     ED Prescriptions   None    PDMP not reviewed this encounter.   Kathrine Haddock 01/30/22 1902

## 2022-02-10 DIAGNOSIS — M25532 Pain in left wrist: Secondary | ICD-10-CM | POA: Diagnosis not present

## 2022-02-16 ENCOUNTER — Ambulatory Visit
Admission: EM | Admit: 2022-02-16 | Discharge: 2022-02-16 | Disposition: A | Discharge status: Home / Self Care | Payer: BC Managed Care – PPO | Attending: Physician Assistant | Admitting: Physician Assistant

## 2022-02-16 DIAGNOSIS — M109 Gout, unspecified: Secondary | ICD-10-CM | POA: Diagnosis not present

## 2022-02-16 MED ORDER — COLCHICINE 0.6 MG PO TABS: 0.6000 mg | ORAL_TABLET | Freq: Two times a day (BID) | ORAL | 0 refills | Status: DC

## 2022-02-16 NOTE — ED Provider Notes (Signed)
[name]    CSN: [id number] Arrival date & time: 02/16/22  1510      History   Chief Complaint Chief Complaint  Patient presents with   Wrist Pain    HPI Grant Ramirez is a 36 y.o. male.   Patient today for evaluation of left wrist pain.  He reports that this has been an intermittent issue and he has been told that he has gout.  He notes that when he took steroids about a month ago symptoms did improve significantly but then returned which prompted further evaluation by Ortho.  A joint aspirate was taken at that time which did confirm gout diagnosis.  Patient reports that he did start taking colchicine yesterday but is about to run out of medication and needs refill.  He any numbness or tingling. Movement of wrist worsens pain.   The history is provided by the patient.  Wrist Pain    Past Medical History:  Diagnosis Date   Asthma    no current inhaler use   Gout    none recent   Sebaceous cyst 03/25/2021   neck    There are no problems to display for this patient.   Past Surgical History:  Procedure Laterality Date   MASS EXCISION N/A 03/27/2021   Procedure: EXCISION OF POSTERIOR NECK SEBACEOUS CYST;  Surgeon: Quentin Ore, MD;  Location: [facility];  Service: General;  Laterality: N/A;   NO PAST SURGERIES         Home Medications    Prior to Admission medications   Medication Sig Start Date End Date Taking? Authorizing Provider  colchicine 0.6 MG tablet Take 1 tablet (0.6 mg total) by mouth 2 (two) times daily. 02/16/22   Tomi Bamberger, PA-C  mupirocin ointment (BACTROBAN) 2 % Apply 1 Application topically 2 (two) times daily. 12/04/21   Raspet, Noberto Retort, PA-C  predniSONE (STERAPRED UNI-PAK 21 TAB) 10 MG (21) TBPK tablet Take by mouth daily. Take 6 tabs by mouth daily  for 2 days, then 5 tabs for 2 days, then 4 tabs for 2 days, then 3 tabs for 2 days, 2 tabs for 2 days, then 1 tab by mouth daily for 2 days 01/20/22    Gustavus Bryant, FNP    Family History Family History  Problem Relation Age of Onset   Diabetes Father    Diabetes Paternal Grandmother     Social History Social History   Tobacco Use   Smoking status: Former    Packs/day: 0.25    Years: 15.00    Total pack years: 3.75    Types: Cigarettes    Quit date: 10/08/2020    Years since quitting: 1.3   Smokeless tobacco: Never  Vaping Use   Vaping Use: Never used  Substance Use Topics   Alcohol use: Yes    Alcohol/week: 1.0 standard drink of alcohol    Types: 1 Shots of liquor per week    Comment: occassionally   Drug use: Yes    Types: Marijuana    Comment: marijuana smoking daily     Allergies   Shellfish allergy   Review of Systems Review of Systems  Constitutional:  Negative for chills and fever.  Eyes:  Negative for discharge and redness.  Musculoskeletal:  Positive for arthralgias and joint swelling.  Skin:  Negative for wound.  Neurological:  Negative for numbness.     Physical Exam Triage Vital Signs ED Triage Vitals  Enc Vitals Group  BP 02/16/22 1555 (!) 137/91     Pulse Rate 02/16/22 1555 77     Resp 02/16/22 1555 18     Temp 02/16/22 1555 97.8 F (36.6 C)     Temp src --      SpO2 02/16/22 1555 98 %     Weight --      Height --      Head Circumference --      Peak Flow --      Pain Score 02/16/22 1559 5     Pain Loc --      Pain Edu? --      Excl. in [place]? --    No data found.  Updated Vital Signs BP (!) 137/91   Pulse 77   Temp 97.8 F (36.6 C)   Resp 18   SpO2 98%      Physical Exam Vitals and nursing note reviewed.  Constitutional:      General: He is not in acute distress.    Appearance: Normal appearance. He is not ill-appearing.  HENT:     Head: Normocephalic and atraumatic.  Eyes:     Conjunctiva/sclera: Conjunctivae normal.  Cardiovascular:     Rate and Rhythm: Normal rate.  Pulmonary:     Effort: Pulmonary effort is normal.  Musculoskeletal:     Comments: Diffuse  swelling to left wrist with TTP to same, decreased ROM of left wrist in all directions due to pain  Neurological:     Mental Status: He is alert.     Comments: Gross sensation intact to distal left fingers  Psychiatric:        Mood and Affect: Mood normal.        Behavior: Behavior normal.        Thought Content: Thought content normal.      UC Treatments / Results  Labs (all labs ordered are listed, but only abnormal results are displayed) Labs Reviewed - No data to display  EKG   Radiology No results found.  Procedures Procedures (including critical care time)  Medications Ordered in UC Medications - No data to display  Initial Impression / Assessment and Plan / UC Course  I have reviewed the triage vital signs and the nursing notes.  Pertinent labs & imaging results that were available during my care of the patient were reviewed by me and considered in my medical decision making (see chart for details).    Colchicine refilled. Recommended further evaluation by ortho with any further concerns or worsening symptoms. Patient expresses understanding.   Final Clinical Impressions(s) / UC Diagnoses   Final diagnoses:  Acute gout of left wrist, unspecified cause   Discharge Instructions   None    ED Prescriptions     Medication Sig Dispense Auth. Provider   colchicine 0.6 MG tablet Take 1 tablet (0.6 mg total) by mouth 2 (two) times daily. 60 tablet Francene Finders, PA-C      PDMP not reviewed this encounter.   Francene Finders, PA-C 02/16/22 [phone]

## 2022-02-18 DIAGNOSIS — G4730 Sleep apnea, unspecified: Secondary | ICD-10-CM | POA: Diagnosis not present

## 2022-02-18 DIAGNOSIS — I10 Essential (primary) hypertension: Secondary | ICD-10-CM | POA: Diagnosis not present

## 2022-02-26 DIAGNOSIS — I10 Essential (primary) hypertension: Secondary | ICD-10-CM | POA: Diagnosis not present

## 2022-02-26 DIAGNOSIS — G4730 Sleep apnea, unspecified: Secondary | ICD-10-CM | POA: Diagnosis not present

## 2022-03-23 ENCOUNTER — Ambulatory Visit
Admission: EM | Admit: 2022-03-23 | Discharge: 2022-03-23 | Disposition: A | Discharge status: Home / Self Care | Payer: BC Managed Care – PPO | Attending: Physician Assistant | Admitting: Physician Assistant

## 2022-03-23 DIAGNOSIS — M109 Gout, unspecified: Secondary | ICD-10-CM

## 2022-03-23 DIAGNOSIS — M25532 Pain in left wrist: Secondary | ICD-10-CM

## 2022-03-23 MED ORDER — COLCHICINE 0.6 MG PO TABS: 0.6000 mg | ORAL_TABLET | Freq: Two times a day (BID) | ORAL | 0 refills | Status: AC

## 2022-03-23 MED ORDER — PREDNISONE 10 MG PO TABS: 10.0000 mg | ORAL_TABLET | Freq: Three times a day (TID) | ORAL | 0 refills | Status: DC

## 2022-03-23 NOTE — Discharge Instructions (Addendum)
Advised take the cholestyramine as directed, 2 tablets daily to help reduce the uric acid or gout level. Advised take prednisone 10 mg 3 times a day for 5 days only to help reduce the acute inflammatory process. Advised take ibuprofen as needed to help reduce pain and discomfort. Advised to follow-up with your appointment that you have in 2 weeks so that she can be started on allopurinol as a preventative gout medication.

## 2022-03-23 NOTE — ED Triage Notes (Signed)
Pt presents to uc with co of L wrist swelling. Pt reports gout reoccurring and is to see a specialist next week and was told in order to get on long term meds he needs to keep his swelling down. Out of his current medication now.

## 2022-03-23 NOTE — ED Provider Notes (Signed)
[name]    CSN: [id number] Arrival date & time: 03/23/22  1022      History   Chief Complaint No chief complaint on file.   HPI Grant Ramirez is a 36 y.o. male.   36 year old male presents with left wrist pain.  Patient indicates he has a history of having gout, he usually takes call Xilin, prednisone and ibuprofen and this helps relieve his discomfort.  He indicates this latest episode of gout in the left wrist has been present with him for several days.  He indicates he does not have any medication to help relieve the discomfort and swelling.  He also indicates he has an appointment in 2 weeks with his PCP and at that time he is going be started on allopurinol as a preventive measure for gout.  Patient denies any trauma to the wrist, and no fever or chills.     Past Medical History:  Diagnosis Date   Asthma    no current inhaler use   Gout    none recent   Sebaceous cyst 03/25/2021   neck    There are no problems to display for this patient.   Past Surgical History:  Procedure Laterality Date   MASS EXCISION N/A 03/27/2021   Procedure: EXCISION OF POSTERIOR NECK SEBACEOUS CYST;  Surgeon: Quentin Ore, MD;  Location: [facility];  Service: General;  Laterality: N/A;   NO PAST SURGERIES         Home Medications    Prior to Admission medications   Medication Sig Start Date End Date Taking? Authorizing Provider  predniSONE (DELTASONE) 10 MG tablet Take 1 tablet (10 mg total) by mouth 3 (three) times daily. 03/23/22  Yes Ellsworth Lennox, PA-C  colchicine 0.6 MG tablet Take 1 tablet (0.6 mg total) by mouth 2 (two) times daily. 03/23/22   Ellsworth Lennox, PA-C  mupirocin ointment (BACTROBAN) 2 % Apply 1 Application topically 2 (two) times daily. 12/04/21   Raspet, Noberto Retort, PA-C  predniSONE (STERAPRED UNI-PAK 21 TAB) 10 MG (21) TBPK tablet Take by mouth daily. Take 6 tabs by mouth daily  for 2 days, then 5 tabs for 2 days, then 4 tabs  for 2 days, then 3 tabs for 2 days, 2 tabs for 2 days, then 1 tab by mouth daily for 2 days Patient not taking: Reported on 03/23/2022 01/20/22   Gustavus Bryant, FNP    Family History Family History  Problem Relation Age of Onset   Diabetes Father    Diabetes Paternal Grandmother     Social History Social History   Tobacco Use   Smoking status: Former    Packs/day: 0.25    Years: 15.00    Total pack years: 3.75    Types: Cigarettes    Quit date: 10/08/2020    Years since quitting: 1.4   Smokeless tobacco: Never  Vaping Use   Vaping Use: Never used  Substance Use Topics   Alcohol use: Yes    Alcohol/week: 1.0 standard drink of alcohol    Types: 1 Shots of liquor per week    Comment: occassionally   Drug use: Yes    Types: Marijuana    Comment: marijuana smoking daily     Allergies   Shellfish allergy   Review of Systems Review of Systems  Musculoskeletal:  Positive for joint swelling (left wrist).     Physical Exam Triage Vital Signs ED Triage Vitals  Enc Vitals Group  BP 03/23/22 1039 (!) 148/89     Pulse Rate 03/23/22 1039 72     Resp 03/23/22 1039 17     Temp 03/23/22 1039 98.2 F (36.8 C)     Temp src --      SpO2 03/23/22 1039 97 %     Weight --      Height --      Head Circumference --      Peak Flow --      Pain Score 03/23/22 1038 4     Pain Loc --      Pain Edu? --      Excl. in GC? --    No data found.  Updated Vital Signs BP (!) 148/89   Pulse 72   Temp 98.2 F (36.8 C)   Resp 17   SpO2 97%   Visual Acuity Right Eye Distance:   Left Eye Distance:   Bilateral Distance:    Right Eye Near:   Left Eye Near:    Bilateral Near:     Physical Exam Constitutional:      Appearance: Normal appearance.  Musculoskeletal:       Arms:     Comments: Left wrist: Pain is palpated along the anterior and posterior wrist area.  Mild swelling is present less than 1+.  Limited range of motion with pain on flexion and extension of the  wrist, internal/external rotation is normal.  Neurological:     Mental Status: He is alert.      UC Treatments / Results  Labs (all labs ordered are listed, but only abnormal results are displayed) Labs Reviewed - No data to display  EKG   Radiology No results found.  Procedures Procedures (including critical care time)  Medications Ordered in UC Medications - No data to display  Initial Impression / Assessment and Plan / UC Course  I have reviewed the triage vital signs and the nursing notes.  Pertinent labs & imaging results that were available during my care of the patient were reviewed by me and considered in my medical decision making (see chart for details).    Plan: 1.  The left wrist pain will be treated with the following: A.  Patient advised take ibuprofen on a regular basis as needed for pain and swelling. B.  Prednisone 10 mg every 8 hours for 5 days only to reduce pain and swelling. 2.  The gout of the left wrist will be treated with the following: A.  Call Xilin 0.6 mg, 2 tablets daily for 3 to 4 days to reduce pain and swelling. 3.  Patient advised to follow-up at his appointment time in order to be started on allopurinol as a preventive medicine for gout.  Final Clinical Impressions(s) / UC Diagnoses   Final diagnoses:  Left wrist pain  Acute gout of left wrist, unspecified cause     Discharge Instructions      Advised take the cholestyramine as directed, 2 tablets daily to help reduce the uric acid or gout level. Advised take prednisone 10 mg 3 times a day for 5 days only to help reduce the acute inflammatory process. Advised take ibuprofen as needed to help reduce pain and discomfort. Advised to follow-up with your appointment that you have in 2 weeks so that she can be started on allopurinol as a preventative gout medication.    ED Prescriptions     Medication Sig Dispense Auth. Provider   colchicine 0.6 MG tablet Take 1 tablet (0.6  mg  total) by mouth 2 (two) times daily. 60 tablet Ellsworth Lennox, PA-C   predniSONE (DELTASONE) 10 MG tablet Take 1 tablet (10 mg total) by mouth 3 (three) times daily. 15 tablet Ellsworth Lennox, PA-C      PDMP not reviewed this encounter.   Ellsworth Lennox, PA-C 03/23/22 1106

## 2022-04-24 ENCOUNTER — Emergency Department (HOSPITAL_COMMUNITY)
Admission: EM | Admit: 2022-04-24 | Discharge: 2022-04-24 | Discharge status: Against Medical Advice | Payer: BC Managed Care – PPO | Attending: Emergency Medicine | Admitting: Emergency Medicine

## 2022-04-24 ENCOUNTER — Other Ambulatory Visit: Payer: Self-pay

## 2022-04-24 DIAGNOSIS — L0211 Cutaneous abscess of neck: Secondary | ICD-10-CM | POA: Insufficient documentation

## 2022-04-24 DIAGNOSIS — Z5321 Procedure and treatment not carried out due to patient leaving prior to being seen by health care provider: Secondary | ICD-10-CM | POA: Diagnosis not present

## 2022-04-24 NOTE — ED Triage Notes (Signed)
Patient coming to ED for evaluation of abscess to back of neck.  Reports noticed symptoms on Tuesday.  Hx of same in past.  C/o pain around site.  No reports of fever.

## 2022-09-02 ENCOUNTER — Encounter (HOSPITAL_BASED_OUTPATIENT_CLINIC_OR_DEPARTMENT_OTHER): Payer: Self-pay | Admitting: Surgery

## 2022-09-02 NOTE — Progress Notes (Signed)
Spoke w/ via phone for pre-op interview--- [facility] needs dos----  NONE per anesthesia. Surgeon orders pending.             Lab results------ COVID test -----patient states asymptomatic no test needed Arrive at -------1030 NPO after MN NO Solid Food.  Clear liquids from MN until---0930 Med rec completed Medications to take morning of surgery -----NONE Diabetic medication ----- Patient instructed no nail polish to be worn day of surgery Patient instructed to bring photo id and insurance card day of surgery Patient aware to have Driver (ride ) / caregiver  Girlfriend Haywood Lasso  for 24 hours after surgery  Patient Special Instructions ----- Pre-Op special Instructions ----- Patient verbalized understanding of instructions that were given at this phone interview. Patient denies shortness of breath, chest pain, fever, cough at this phone interview.

## 2022-09-03 ENCOUNTER — Ambulatory Visit: Payer: Self-pay | Admitting: Surgery

## 2022-09-19 NOTE — Anesthesia Preprocedure Evaluation (Signed)
Anesthesia Evaluation  Patient identified by MRN, date of birth, ID band Patient awake    Reviewed: Allergy & Precautions, NPO status , Patient's Chart, lab work & pertinent test results  Airway Mallampati: II  TM Distance: >3 FB Neck ROM: Full    Dental no notable dental hx. (+) Teeth Intact, Dental Advisory Given   Pulmonary Current Smoker and Patient abstained from smoking.   Pulmonary exam normal breath sounds clear to auscultation       Cardiovascular Exercise Tolerance: Good negative cardio ROS Normal cardiovascular exam Rhythm:Regular Rate:Normal     Neuro/Psych negative neurological ROS  negative psych ROS   GI/Hepatic negative GI ROS, Neg liver ROS,,,  Endo/Other  negative endocrine ROS    Renal/GU negative Renal ROS     Musculoskeletal negative musculoskeletal ROS (+)    Abdominal  (+) + obese  Peds  Hematology   Anesthesia Other Findings   Reproductive/Obstetrics                             Anesthesia Physical Anesthesia Plan  ASA: 3  Anesthesia Plan: General   Post-op Pain Management: Toradol IV (intra-op)*, Precedex and Tylenol PO (pre-op)*   Induction: Intravenous  PONV Risk Score and Plan: 2 and Treatment may vary due to age or medical condition, Midazolam, Ondansetron and Dexamethasone  Airway Management Planned: Oral ETT and Video Laryngoscope Planned  Additional Equipment: None  Intra-op Plan:   Post-operative Plan: Extubation in OR  Informed Consent: I have reviewed the patients History and Physical, chart, labs and discussed the procedure including the risks, benefits and alternatives for the proposed anesthesia with the patient or authorized representative who has indicated his/her understanding and acceptance.     Dental advisory given  Plan Discussed with: CRNA  Anesthesia Plan Comments:        Anesthesia Quick Evaluation

## 2022-09-20 ENCOUNTER — Ambulatory Visit (HOSPITAL_BASED_OUTPATIENT_CLINIC_OR_DEPARTMENT_OTHER)
Admission: RE | Admit: 2022-09-20 | Discharge: 2022-09-20 | Disposition: A | Discharge status: Home / Self Care | Payer: BC Managed Care – PPO | Attending: Surgery | Admitting: Surgery

## 2022-09-20 ENCOUNTER — Ambulatory Visit (HOSPITAL_BASED_OUTPATIENT_CLINIC_OR_DEPARTMENT_OTHER): Payer: BC Managed Care – PPO | Admitting: Anesthesiology

## 2022-09-20 ENCOUNTER — Encounter (HOSPITAL_BASED_OUTPATIENT_CLINIC_OR_DEPARTMENT_OTHER): Payer: Self-pay | Admitting: Surgery

## 2022-09-20 ENCOUNTER — Encounter (HOSPITAL_BASED_OUTPATIENT_CLINIC_OR_DEPARTMENT_OTHER)
Admission: RE | Disposition: A | Discharge status: Home / Self Care | Payer: Self-pay | Source: Home / Self Care | Attending: Surgery

## 2022-09-20 ENCOUNTER — Other Ambulatory Visit: Payer: Self-pay

## 2022-09-20 DIAGNOSIS — F17210 Nicotine dependence, cigarettes, uncomplicated: Secondary | ICD-10-CM | POA: Diagnosis not present

## 2022-09-20 DIAGNOSIS — L720 Epidermal cyst: Secondary | ICD-10-CM | POA: Diagnosis not present

## 2022-09-20 DIAGNOSIS — L723 Sebaceous cyst: Secondary | ICD-10-CM | POA: Diagnosis present

## 2022-09-20 HISTORY — PX: LESION REMOVAL: SHX5196

## 2022-09-20 HISTORY — PX: EXCISION OF KELOID: SHX6267

## 2022-09-20 LAB — GLUCOSE, CAPILLARY: Glucose-Capillary: 87 mg/dL (ref 70–99)

## 2022-09-20 SURGERY — EXCISION, LESION, NECK
Anesthesia: General | Site: Neck | Laterality: Right

## 2022-09-20 MED ORDER — GABAPENTIN 300 MG PO CAPS
ORAL_CAPSULE | ORAL | Status: AC
  Filled 2022-09-20: qty 1

## 2022-09-20 MED ORDER — ROCURONIUM BROMIDE 100 MG/10ML IV SOLN
INTRAVENOUS | Status: DC | PRN
  Administered 2022-09-20: 60 mg via INTRAVENOUS
  Administered 2022-09-20: 20 mg via INTRAVENOUS

## 2022-09-20 MED ORDER — MIDAZOLAM HCL 5 MG/5ML IJ SOLN
INTRAMUSCULAR | Status: DC | PRN
  Administered 2022-09-20: 2 mg via INTRAVENOUS

## 2022-09-20 MED ORDER — CHLORHEXIDINE GLUCONATE CLOTH 2 % EX PADS: 6.0000 | MEDICATED_PAD | Freq: Once | CUTANEOUS | Status: DC

## 2022-09-20 MED ORDER — MIDAZOLAM HCL 2 MG/2ML IJ SOLN
INTRAMUSCULAR | Status: AC
  Filled 2022-09-20: qty 2

## 2022-09-20 MED ORDER — SUCCINYLCHOLINE CHLORIDE 200 MG/10ML IV SOSY
PREFILLED_SYRINGE | INTRAVENOUS | Status: DC | PRN
  Administered 2022-09-20: 160 mg via INTRAVENOUS

## 2022-09-20 MED ORDER — ONDANSETRON HCL 4 MG/2ML IJ SOLN
INTRAMUSCULAR | Status: AC
  Filled 2022-09-20: qty 2

## 2022-09-20 MED ORDER — OXYCODONE-ACETAMINOPHEN 5-325 MG PO TABS: 1.0000 | ORAL_TABLET | ORAL | 0 refills | Status: AC | PRN

## 2022-09-20 MED ORDER — ONDANSETRON HCL 4 MG/2ML IJ SOLN
INTRAMUSCULAR | Status: DC | PRN
  Administered 2022-09-20: 4 mg via INTRAVENOUS

## 2022-09-20 MED ORDER — DEXAMETHASONE SODIUM PHOSPHATE 10 MG/ML IJ SOLN
INTRAMUSCULAR | Status: AC
  Filled 2022-09-20: qty 1

## 2022-09-20 MED ORDER — CEFAZOLIN IN SODIUM CHLORIDE 3-0.9 GM/100ML-% IV SOLN
INTRAVENOUS | Status: AC
  Filled 2022-09-20: qty 100

## 2022-09-20 MED ORDER — LIDOCAINE HCL (PF) 2 % IJ SOLN
INTRAMUSCULAR | Status: AC
  Filled 2022-09-20: qty 5

## 2022-09-20 MED ORDER — ACETAMINOPHEN 500 MG PO TABS
1000.0000 mg | ORAL_TABLET | ORAL | Status: AC
  Administered 2022-09-20: 1000 mg via ORAL

## 2022-09-20 MED ORDER — GABAPENTIN 300 MG PO CAPS
300.0000 mg | ORAL_CAPSULE | ORAL | Status: AC
  Administered 2022-09-20: 300 mg via ORAL

## 2022-09-20 MED ORDER — FENTANYL CITRATE (PF) 100 MCG/2ML IJ SOLN
INTRAMUSCULAR | Status: AC
  Filled 2022-09-20: qty 2

## 2022-09-20 MED ORDER — CEFAZOLIN IN SODIUM CHLORIDE 3-0.9 GM/100ML-% IV SOLN
3.0000 g | INTRAVENOUS | Status: AC
  Administered 2022-09-20: 3 g via INTRAVENOUS

## 2022-09-20 MED ORDER — LACTATED RINGERS IV SOLN: INTRAVENOUS | Status: DC

## 2022-09-20 MED ORDER — CEFAZOLIN SODIUM-DEXTROSE 2-4 GM/100ML-% IV SOLN: 2.0000 g | INTRAVENOUS | Status: DC

## 2022-09-20 MED ORDER — OXYCODONE HCL 5 MG/5ML PO SOLN: 5.0000 mg | Freq: Once | ORAL | Status: DC | PRN

## 2022-09-20 MED ORDER — KETOROLAC TROMETHAMINE 15 MG/ML IJ SOLN: 15.0000 mg | INTRAMUSCULAR | Status: DC

## 2022-09-20 MED ORDER — ONDANSETRON HCL 4 MG/2ML IJ SOLN: 4.0000 mg | Freq: Once | INTRAMUSCULAR | Status: DC | PRN

## 2022-09-20 MED ORDER — PROPOFOL 10 MG/ML IV BOLUS
INTRAVENOUS | Status: AC
  Filled 2022-09-20: qty 20

## 2022-09-20 MED ORDER — SUGAMMADEX SODIUM 200 MG/2ML IV SOLN
INTRAVENOUS | Status: DC | PRN
  Administered 2022-09-20: 200 mg via INTRAVENOUS

## 2022-09-20 MED ORDER — ROCURONIUM BROMIDE 10 MG/ML (PF) SYRINGE
PREFILLED_SYRINGE | INTRAVENOUS | Status: AC
  Filled 2022-09-20: qty 10

## 2022-09-20 MED ORDER — 0.9 % SODIUM CHLORIDE (POUR BTL) OPTIME
TOPICAL | Status: DC | PRN
  Administered 2022-09-20: 5000 mL

## 2022-09-20 MED ORDER — DEXAMETHASONE SODIUM PHOSPHATE 4 MG/ML IJ SOLN
INTRAMUSCULAR | Status: DC | PRN
  Administered 2022-09-20: 10 mg via INTRAVENOUS

## 2022-09-20 MED ORDER — HYDROMORPHONE HCL 1 MG/ML IJ SOLN: 0.2500 mg | INTRAMUSCULAR | Status: DC | PRN

## 2022-09-20 MED ORDER — GLYCOPYRROLATE PF 0.2 MG/ML IJ SOSY
PREFILLED_SYRINGE | INTRAMUSCULAR | Status: AC
  Filled 2022-09-20: qty 1

## 2022-09-20 MED ORDER — ACETAMINOPHEN 500 MG PO TABS
ORAL_TABLET | ORAL | Status: AC
  Filled 2022-09-20: qty 2

## 2022-09-20 MED ORDER — OXYCODONE HCL 5 MG PO TABS: 5.0000 mg | ORAL_TABLET | Freq: Once | ORAL | Status: DC | PRN

## 2022-09-20 MED ORDER — DEXMEDETOMIDINE HCL IN NACL 80 MCG/20ML IV SOLN
INTRAVENOUS | Status: DC | PRN
  Administered 2022-09-20 (×2): 8 ug via INTRAVENOUS

## 2022-09-20 MED ORDER — BUPIVACAINE-EPINEPHRINE 0.25% -1:200000 IJ SOLN
INTRAMUSCULAR | Status: DC | PRN
  Administered 2022-09-20: 10 mL

## 2022-09-20 MED ORDER — LIDOCAINE HCL (CARDIAC) PF 100 MG/5ML IV SOSY
PREFILLED_SYRINGE | INTRAVENOUS | Status: DC | PRN
  Administered 2022-09-20: 100 mg via INTRAVENOUS

## 2022-09-20 MED ORDER — GLYCOPYRROLATE 0.2 MG/ML IJ SOLN
INTRAMUSCULAR | Status: DC | PRN
  Administered 2022-09-20: .2 mg via INTRAVENOUS

## 2022-09-20 MED ORDER — PROPOFOL 10 MG/ML IV BOLUS
INTRAVENOUS | Status: DC | PRN
  Administered 2022-09-20: 200 mg via INTRAVENOUS

## 2022-09-20 MED ORDER — SUCCINYLCHOLINE CHLORIDE 200 MG/10ML IV SOSY
PREFILLED_SYRINGE | INTRAVENOUS | Status: AC
  Filled 2022-09-20: qty 10

## 2022-09-20 MED ORDER — KETOROLAC TROMETHAMINE 30 MG/ML IJ SOLN: 30.0000 mg | Freq: Once | INTRAMUSCULAR | Status: DC | PRN

## 2022-09-20 MED ORDER — FENTANYL CITRATE (PF) 100 MCG/2ML IJ SOLN
INTRAMUSCULAR | Status: DC | PRN
  Administered 2022-09-20: 100 ug via INTRAVENOUS
  Administered 2022-09-20 (×2): 50 ug via INTRAVENOUS

## 2022-09-20 SURGICAL SUPPLY — 52 items
ADH SKN CLS APL DERMABOND .7 (GAUZE/BANDAGES/DRESSINGS)
APL PRP STRL LF DISP 70% ISPRP (MISCELLANEOUS) ×4
BLADE SURG 15 STRL LF DISP TIS (BLADE) ×2 IMPLANT
BLADE SURG 15 STRL SS (BLADE) ×2
BNDG GAUZE DERMACEA FLUFF 4 (GAUZE/BANDAGES/DRESSINGS) IMPLANT
BNDG GZE DERMACEA 4 6PLY (GAUZE/BANDAGES/DRESSINGS) ×4
CHLORAPREP W/TINT 26 (MISCELLANEOUS) IMPLANT
COVER BACK TABLE 60X90IN (DRAPES) ×2 IMPLANT
COVER MAYO STAND STRL (DRAPES) ×2 IMPLANT
DERMABOND ADVANCED .7 DNX12 (GAUZE/BANDAGES/DRESSINGS) IMPLANT
DRAPE LAPAROTOMY 100X72 PEDS (DRAPES) IMPLANT
DRAPE UTILITY XL STRL (DRAPES) ×2 IMPLANT
ELECT REM PT RETURN 9FT ADLT (ELECTROSURGICAL) ×2
ELECTRODE REM PT RTRN 9FT ADLT (ELECTROSURGICAL) ×2 IMPLANT
GAUZE 4X4 16PLY ~~LOC~~+RFID DBL (SPONGE) ×2 IMPLANT
GAUZE PAD ABD 8X10 STRL (GAUZE/BANDAGES/DRESSINGS) IMPLANT
GAUZE SPONGE 4X4 12PLY STRL (GAUZE/BANDAGES/DRESSINGS) ×2 IMPLANT
GLOVE BIO SURGEON STRL SZ 6 (GLOVE) ×2 IMPLANT
GLOVE BIOGEL PI IND STRL 8 (GLOVE) ×2 IMPLANT
GLOVE INDICATOR 6.5 STRL GRN (GLOVE) ×2 IMPLANT
GLOVE SURG PR MICRO ENCORE 7.5 (GLOVE) ×2 IMPLANT
GOWN STRL REUS W/TWL LRG LVL3 (GOWN DISPOSABLE) ×2 IMPLANT
KIT SUTURE REMOVAL HAMOT (SET/KITS/TRAYS/PACK) IMPLANT
KIT TURNOVER CYSTO (KITS) ×2 IMPLANT
NDL HYPO 22X1.5 SAFETY MO (MISCELLANEOUS) IMPLANT
NDL HYPO 25X1 1.5 SAFETY (NEEDLE) IMPLANT
NEEDLE HYPO 22X1.5 SAFETY MO (MISCELLANEOUS) IMPLANT
NEEDLE HYPO 25X1 1.5 SAFETY (NEEDLE) ×2 IMPLANT
PACK BASIN DAY SURGERY FS (CUSTOM PROCEDURE TRAY) ×2 IMPLANT
PENCIL SMOKE EVACUATOR (MISCELLANEOUS) ×2 IMPLANT
PUNCH BIOPSY 4MM (MISCELLANEOUS) ×2
PUNCH BIOPSY DISP 4 (MISCELLANEOUS) IMPLANT
SLEEVE SCD COMPRESS KNEE LRG (STOCKING) IMPLANT
SLEEVE SCD COMPRESS KNEE MED (STOCKING) ×2 IMPLANT
SPONGE T-LAP 18X18 ~~LOC~~+RFID (SPONGE) IMPLANT
SPONGE T-LAP 4X18 ~~LOC~~+RFID (SPONGE) IMPLANT
SUCTION FRAZIER HANDLE 10FR (MISCELLANEOUS)
SUCTION TUBE FRAZIER 10FR DISP (MISCELLANEOUS) IMPLANT
SUT CHROMIC 3 0 SH 27 (SUTURE) IMPLANT
SUT ETHILON 2 0 FS 18 (SUTURE) IMPLANT
SUT MNCRL AB 4-0 PS2 18 (SUTURE) ×2 IMPLANT
SUT VIC AB 2-0 SH 27 (SUTURE)
SUT VIC AB 2-0 SH 27XBRD (SUTURE) IMPLANT
SUT VIC AB 3-0 SH 18 (SUTURE) ×2 IMPLANT
SUT VIC AB 3-0 SH 27 (SUTURE)
SUT VIC AB 3-0 SH 27X BRD (SUTURE) ×2 IMPLANT
SYR BULB IRRIG 60ML STRL (SYRINGE) ×2 IMPLANT
SYR CONTROL 10ML LL (SYRINGE) ×2 IMPLANT
TAPE CLOTH SURG 4X10 WHT LF (GAUZE/BANDAGES/DRESSINGS) IMPLANT
TOWEL OR 17X24 6PK STRL BLUE (TOWEL DISPOSABLE) ×2 IMPLANT
TUBE CONNECTING 12X1/4 (SUCTIONS) ×2 IMPLANT
YANKAUER SUCT BULB TIP NO VENT (SUCTIONS) ×2 IMPLANT

## 2022-09-20 NOTE — Transfer of Care (Signed)
Immediate Anesthesia Transfer of Care Note  Patient: Grant Ramirez  Procedure(s) Performed: Procedure(s) (LRB): EXCISION RIGHT NECK SEBACEOUS CYST (Right) EXCISION RIGHT FLANK SEBACEOUS CYST (Right)  Patient Location: PACU  Anesthesia Type: General  Level of Consciousness: awake, sedated, patient cooperative and responds to stimulation  Airway & Oxygen Therapy: Patient Spontanous Breathing and Patient connected to [place] oxygen  Post-op Assessment: Report given to PACU RN, Post -op Vital signs reviewed and stable and Patient moving all extremities  Post vital signs: Reviewed and stable  Complications: No apparent anesthesia complications

## 2022-09-20 NOTE — Anesthesia Procedure Notes (Addendum)
Procedure Name: Intubation Date/Time: 09/20/2022 12:50 PM  Performed by: Jessica Priest, CRNAPre-anesthesia Checklist: Patient identified, Emergency Drugs available, Suction available, Patient being monitored and Timeout performed Patient Re-evaluated:Patient Re-evaluated prior to induction Oxygen Delivery Method: Circle system utilized Preoxygenation: Pre-oxygenation with 100% oxygen Induction Type: IV induction, Rapid sequence and Cricoid Pressure applied Ventilation: Mask ventilation without difficulty Laryngoscope Size: Mac and 4 Grade View: Grade II Tube type: Oral Tube size: 7.5 mm Number of attempts: 1 Airway Equipment and Method: Stylet and Oral airway Placement Confirmation: ETT inserted through vocal cords under direct vision, positive ETCO2, breath sounds checked- equal and bilateral and CO2 detector Secured at: 23 cm Tube secured with: Tape Dental Injury: Teeth and Oropharynx as per pre-operative assessment

## 2022-09-20 NOTE — Anesthesia Postprocedure Evaluation (Signed)
Anesthesia Post Note  Patient: Grant Ramirez  Procedure(s) Performed: EXCISION RIGHT NECK SEBACEOUS CYST (Right: Neck) EXCISION RIGHT FLANK SEBACEOUS CYST (Right: Flank)     Patient location during evaluation: PACU Anesthesia Type: General Level of consciousness: awake and alert Pain management: pain level controlled Vital Signs Assessment: post-procedure vital signs reviewed and stable Respiratory status: spontaneous breathing, nonlabored ventilation, respiratory function stable and patient connected to nasal cannula oxygen Cardiovascular status: blood pressure returned to baseline and stable Postop Assessment: no apparent nausea or vomiting Anesthetic complications: no  No notable events documented.  Last Vitals:  Vitals:   09/20/22 1430 09/20/22 1515  BP: (!) 161/92 (!) 161/87  Pulse:  (!) 56  Resp:  16  Temp:  36.4 C  SpO2: 96% 97%    Last Pain:  Vitals:   09/20/22 1430  TempSrc:   PainSc: 0-No pain                 [name]

## 2022-09-20 NOTE — H&P (Signed)
Admitting Physician: Hyman Hopes Daylin Gruszka  Service: General surgery  CC: Neck and flank sebaceous cyst  Subjective   HPI: Grant Ramirez is an 37 y.o. male who is here for right neck and right flank sebaceous cyst removal  Past Medical History:  Diagnosis Date   Asthma    no current inhaler use   Gout    none recent   Sebaceous cyst 03/25/2021   neck    Past Surgical History:  Procedure Laterality Date   MASS EXCISION N/A 03/27/2021   Procedure: EXCISION OF POSTERIOR NECK SEBACEOUS CYST;  Surgeon: Quentin Ore, MD;  Location: [facility];  Service: General;  Laterality: N/A;   NO PAST SURGERIES      Family History  Problem Relation Age of Onset   Diabetes Father    Diabetes Paternal Grandmother     Social:  reports that he has been smoking cigarettes. He has a 3.75 pack-year smoking history. He has never used smokeless tobacco. He reports current alcohol use of about 1.0 standard drink of alcohol per week. He reports current drug use. Drug: Marijuana.  Allergies:  Allergies  Allergen Reactions   Shellfish Allergy Anaphylaxis    Medications: Current Outpatient Medications  Medication Instructions   colchicine 0.6 mg, Oral, 2 times daily   mupirocin ointment (BACTROBAN) 2 % 1 Application, Topical, 2 times daily    ROS - all of the below systems have been reviewed with the patient and positives are indicated with bold text General: chills, fever or night sweats Eyes: blurry vision or double vision ENT: epistaxis or sore throat Allergy/Immunology: itchy/watery eyes or nasal congestion Hematologic/Lymphatic: bleeding problems, blood clots or swollen lymph nodes Endocrine: temperature intolerance or unexpected weight changes Breast: new or changing breast lumps or nipple discharge Resp: cough, shortness of breath, or wheezing CV: chest pain or dyspnea on exertion GI: as per HPI GU: dysuria, trouble voiding, or hematuria MSK: joint pain  or joint stiffness Neuro: TIA or stroke symptoms Derm: pruritus and skin lesion changes Psych: anxiety and depression  Objective   PE Blood pressure (!) 152/93, pulse (!) 102, temperature 98.3 F (36.8 C), temperature source Oral, resp. rate 18, height 6\' 1"  (1.854 m), weight (!) 173.4 kg, SpO2 99 %. Constitutional: NAD; conversant; no deformities Eyes: Moist conjunctiva; no lid lag; anicteric; PERRL Neck: Trachea midline; no thyromegaly Lungs: Normal respiratory effort; no tactile fremitus CV: RRR; no palpable thrills; no pitting edema GI: Abd Soft, nontender; no palpable hepatosplenomegaly MSK: Normal range of motion of extremities; no clubbing/cyanosis Psychiatric: Appropriate affect; alert and oriented x3 Lymphatic: No palpable cervical or axillary lymphadenopathy  Scars from previous incisiona nd drainage with underlying fullness consistent with sebaceous cysts right neck and right flank.  Results for orders placed or performed during the hospital encounter of 09/20/22 (from the past 24 hour(s))  Glucose, capillary     Status: None   Collection Time: 09/20/22 11:17 AM  Result Value Ref Range   Glucose-Capillary 87 70 - 99 mg/dL    Imaging Orders  No imaging studies ordered today     Assessment and Plan   Grant Ramirez is an 37 y.o. male with sebaceous cysts right neck and right flank.  I recommended excision with open packing as this is a recurrent issue.  We discussed the procedure, its risks, benefits and alteratives.  After a full discussion and all questions answered the patient granted consent to proceed.     Quentin Ore, MD  [facility] Use [email or web address] to contact on call provider

## 2022-09-20 NOTE — Op Note (Signed)
Patient: Grant Ramirez ([birth year], [id number])  Date of Surgery: 09/20/2022   Preoperative Diagnosis: RIGHT NECK AND RIGHT FLANK SEBACEOUS CYSTS   Postoperative Diagnosis: RIGHT NECK (4cm x 2cm) AND RIGHT FLANK (7cm x 3cm) SEBACEOUS CYSTS   Surgical Procedure: EXCISION RIGHT NECK SEBACEOUS CYST: 11421 (CPT) EXCISION RIGHT FLANK SEBACEOUS CYST: 11401 (CPT)   Operative Team Members:  Surgeon(s) and Role:    * Aster Eckrich, Hyman Hopes, MD - Primary   Anesthesiologist: [name], MD CRNA: Jessica Priest, CRNA   Anesthesia: General   Fluids:  Total I/O In: -  Out: 50 [Blood:50]  Complications: * No complications entered in OR log *  Drains:  none   Specimen:  ID Type Source Tests Collected by Time Destination  1 : Neck sebaceous cyst Tissue Path Tissue SURGICAL PATHOLOGY [name], Hyman Hopes, MD 09/20/2022 1330   2 : Right flank sebaceous cyst Tissue Path Tissue SURGICAL PATHOLOGY [name], Hyman Hopes, MD 09/20/2022 1341      Disposition:  PACU - hemodynamically stable.  Plan of Care: Discharge to home after PACU    Indications for Procedure: Grant Ramirez is a 37 y.o. male who presented with recurrent right neck infection consistent with a sebaceous cyst.  He also has a right flank infection consistent with a sebaceous cyst.  I placed him on preoperative antibiotics.  He presents today for right neck and right flank sebaceous cyst excision.  Due to the recurrent nature of these infections and his risk factors for continued issues, I explained that I would leave the wound open for packing to avoid postoperative infection.  The procedure itself as well as its risks, benefits and alternatives were discussed.  The risks discussed included but were not limited to the risk of infection, bleeding, damage to nearby structures, and recurrent issues.  After a full discussion and all questions answered the patient granted consent to proceed.  Findings: Sebaceous cyst in neck  that tracked to skin in two directions. RIGHT NECK (4cm x 2cm) AND RIGHT FLANK (7cm x 3cm) SEBACEOUS CYSTS    Description of Procedure:   On the date stated above the patient taken operating suite.  General endotracheal anesthesia was induced.  A timeout was completed verifying the correct patient, procedure, position, and equipment needed for the case.  Antibiotics were given prior to the case start.  The patient was positioned on left lateral positioning.  The right neck and right flank were prepped and draped in usual sterile fashion.  Starting the neck, I made an elliptical incision around the scar from previous incision and drainages.  I dissected down through the skin and subcutaneous tissues and identified the cyst.  Retraction on the cyst revealed a second sinus tract to the skin just superior to the incision had already made.  A punch biopsy was used to make a circular incision around the sinus and dissected out to fully resect the cyst and all sinus tracts.  This left an incision and a small superior counterincision.  The wound was irrigated and packing was placed in the wound.  The neck cyst measured 4 cm x 2 cm in size.    I then directed my attention to the right flank cyst.  An elliptical incision was made around the previous scar and sinuses at the skin.  I dissected down through the subcutaneous tissues to resect the inflamed, scarred, and cyst area in total.  I included a normal border of normal tissue.  This was passed  off the field and measured 7 cm x 3 cm.  The wound was irrigated with saline and then packed.  All sponge needle counts were correct at the end this case.    At the end of the case we reviewed the infection status of the case. Patient: Private Patient Elective Case Case: Elective Infection Present At Time Of Surgery (PATOS): Infection of the sebaceous cysts involving the skin and subcutaneous tissues  Ivar Drape, MD General, Bariatric, & Minimally Invasive  Surgery [facility], [place]

## 2022-09-20 NOTE — Discharge Instructions (Signed)

## 2022-09-22 ENCOUNTER — Encounter (HOSPITAL_BASED_OUTPATIENT_CLINIC_OR_DEPARTMENT_OTHER): Payer: Self-pay | Admitting: Surgery

## 2022-09-22 LAB — SURGICAL PATHOLOGY

## 2022-12-07 IMAGING — US US SOFT TISSUE HEAD/NECK
1 series · 14 of 25 positions shown · non-contrast
Comparison: None.

CLINICAL DATA: 34-year-old male with a mass to the back of the
right neck. Patient reports he has previously drained fluid from the
mass

EXAM:
ULTRASOUND OF HEAD/NECK SOFT TISSUES
TECHNIQUE: Ultrasound examination of the head and neck soft tissues was
performed in the area of clinical concern.

[Series 1: us soft tissue head & neck (non-thyroid) · 32 acquisitions, 14 frames shown]
[im 1/32]
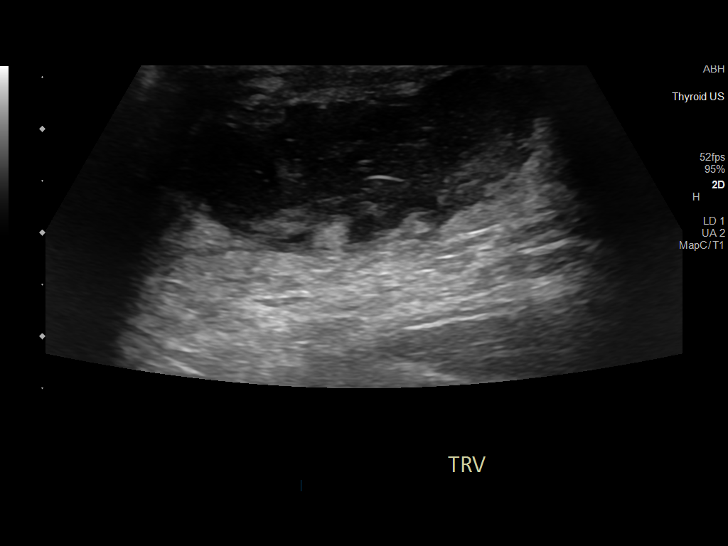
[im 3/32]
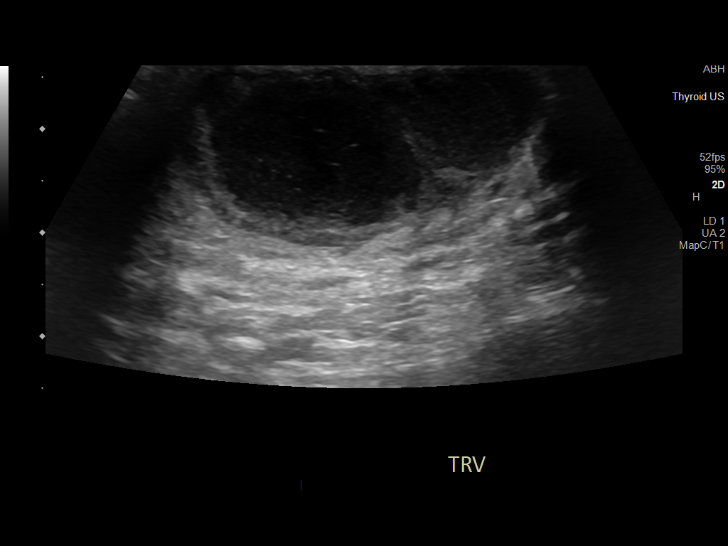
[im 6/32]
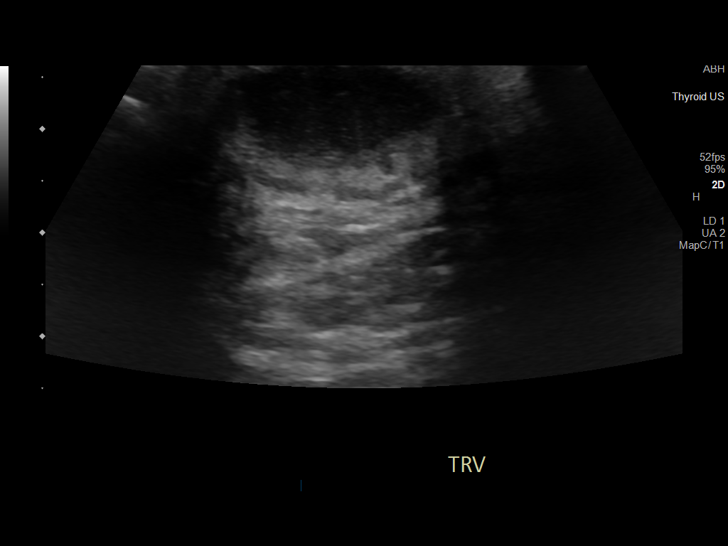
[im 8/32]
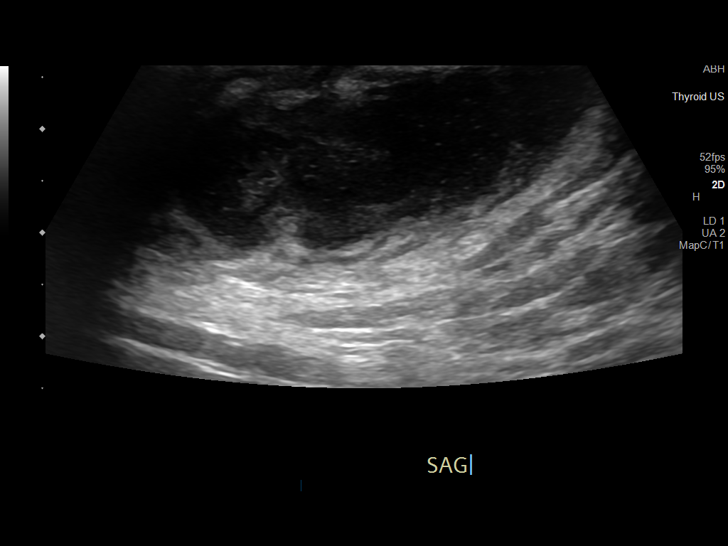
[im 11/32]
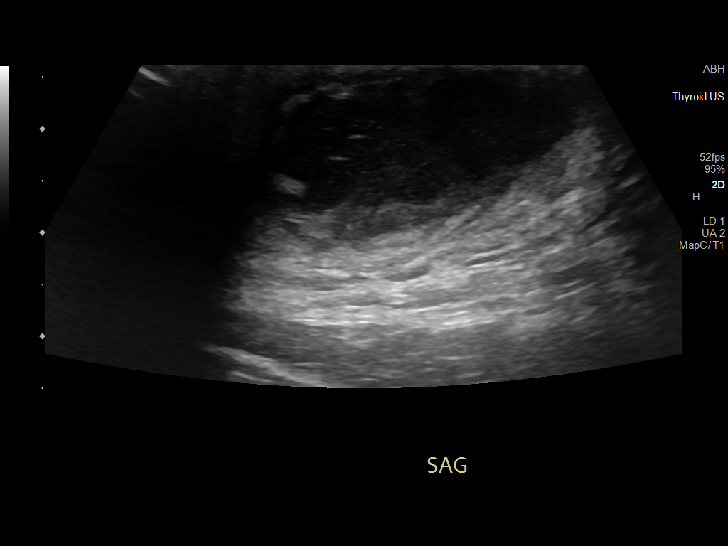
[im 12/32]
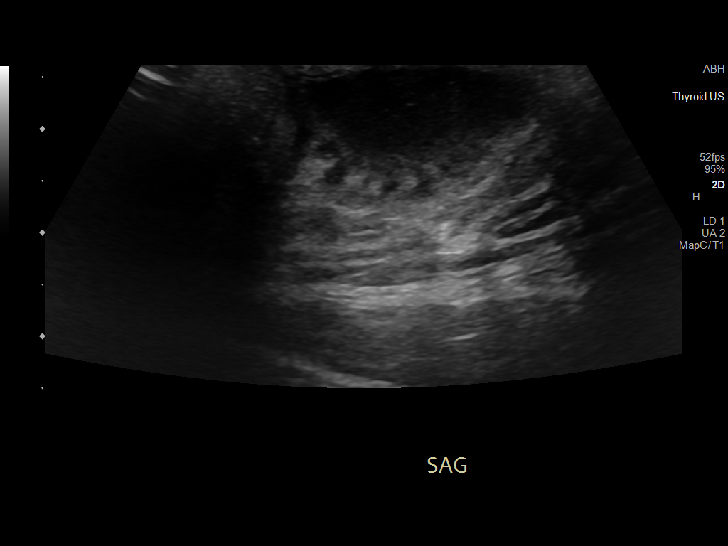
[im 15/32]
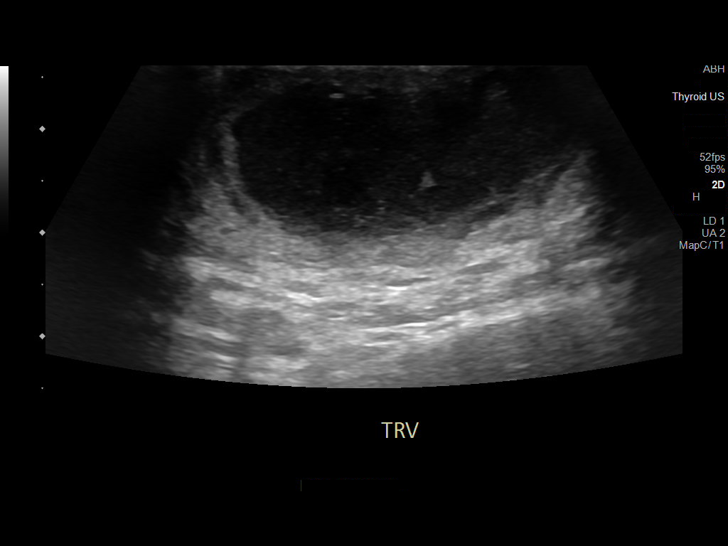
[im 17/32]
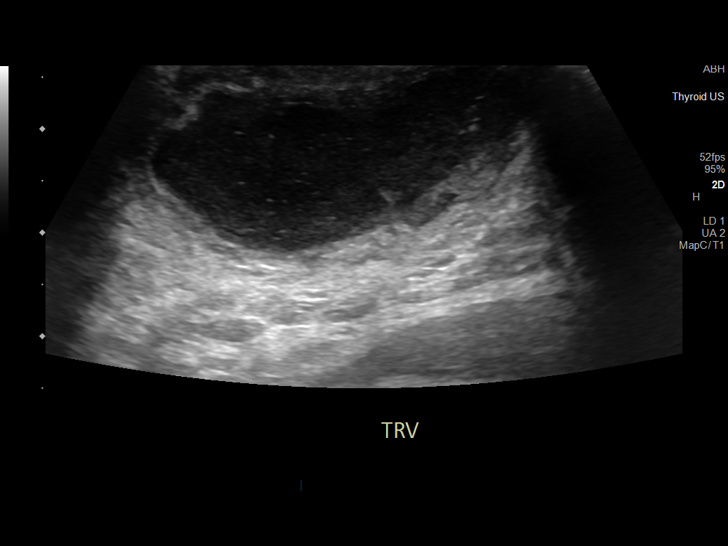
[im 20/32]
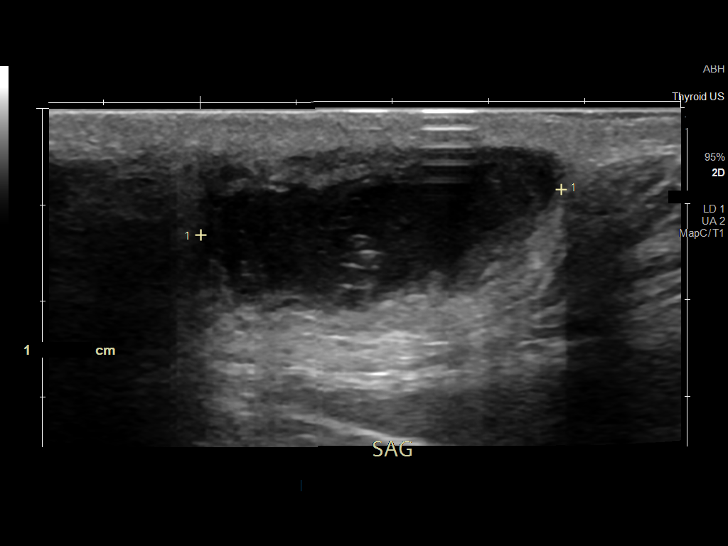
[im 21/32]
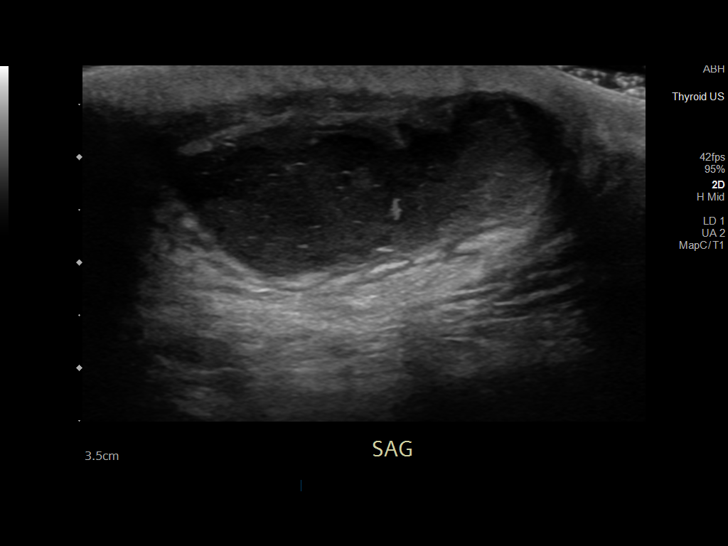
[im 24/32]
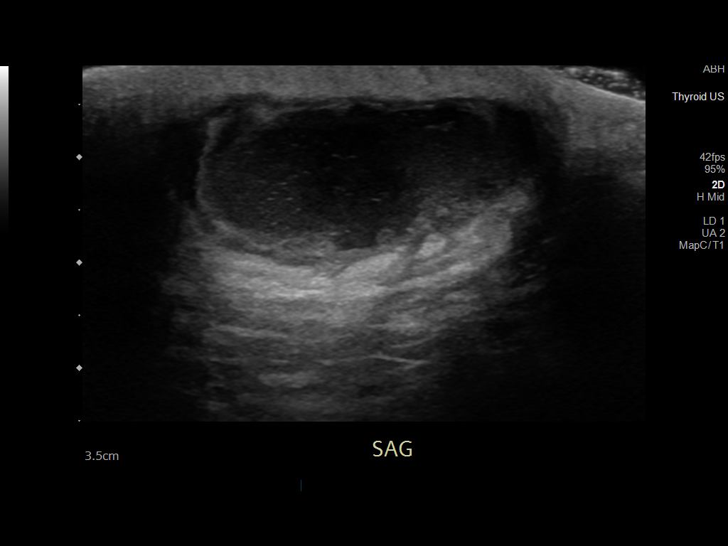
[im 26/32]
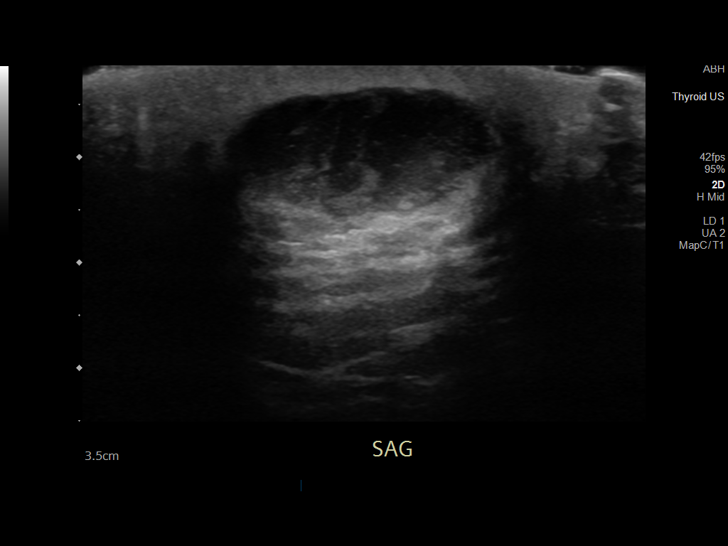
[im 29/32]
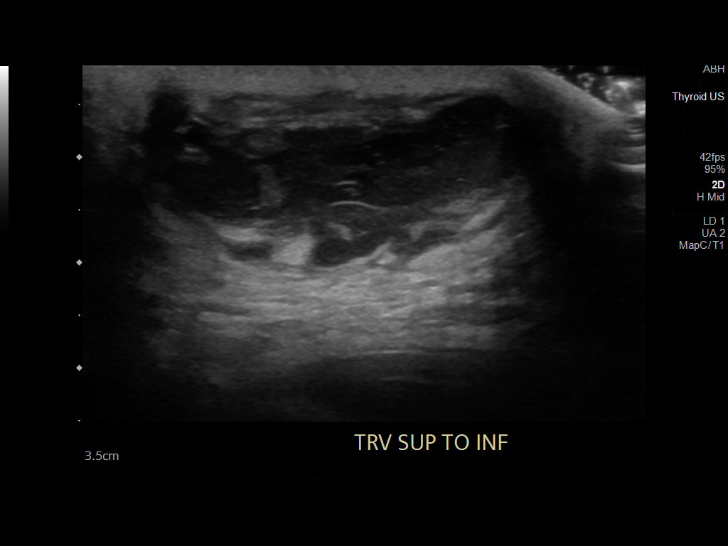
[im 32/32]
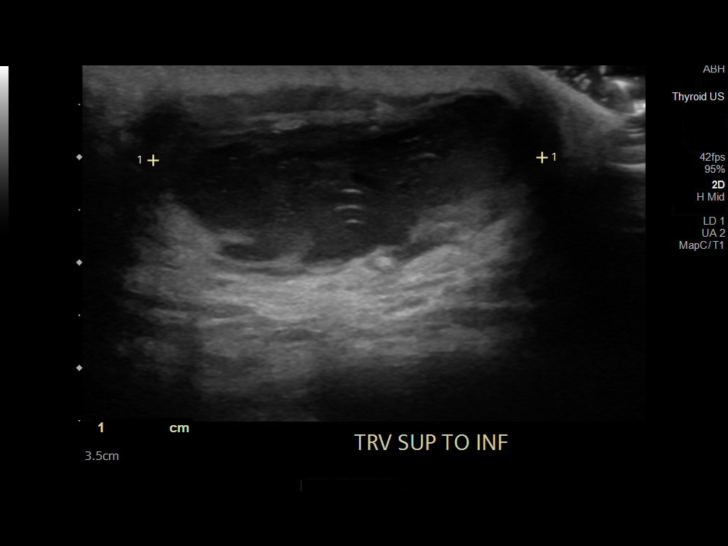

[14 of 25 positions shown; findings below may reference images not displayed]

FINDINGS: Within the subcutaneous tissue in the area of palpable concern is a
heterogeneously hypoechoic collection, with increased through
transmission, measuring approximately 3.8 x 1.5 by 3.7 cm.

No appreciable Doppler flow within the collection. The collection
appears to have a thin septations and a suspected track to skin (see
key image).
IMPRESSION: 3.8 cm subcutaneous fluid collection at the posterior right neck,
with superficial tract.

Findings favored to represent sebaceous cyst with superimposed
abscess.

## 2024-02-02 ENCOUNTER — Encounter: Payer: Self-pay | Admitting: Neurology

## 2024-05-01 ENCOUNTER — Ambulatory Visit: Admitting: Neurology

## 2024-05-01 ENCOUNTER — Encounter: Payer: Self-pay | Admitting: Neurology

## 2024-05-01 VITALS — BP 149/87 | HR 87 | Ht 73.0 in | Wt 363.0 lb

## 2024-05-01 DIAGNOSIS — G5622 Lesion of ulnar nerve, left upper limb: Secondary | ICD-10-CM | POA: Diagnosis not present

## 2024-05-01 NOTE — Progress Notes (Signed)
 [facility] Clinic Note - Initial Visit   Date: 05/01/2024   BRONTE KROPF MRN: [id number] DOB: [birth year]   Dear Dr. Austin:  Thank you for your kind referral of Grant Ramirez for consultation of left hand paresthesia. Although his history is well known to you, please allow us  to reiterate it for the purpose of our medical record. The patient was accompanied to the clinic by self.    Grant Ramirez is a 38 y.o. left-handed male presenting for evaluation of left hand paresthesia.   IMPRESSION/PLAN: Assessment & Plan Left ulnar neuropathy at the elbow.  Initially, symptoms were constant and have improved since onset.   - NCS/EMG of the left arm - Educated on avoiding positions that compress the ulnar nerve.  ------------------------------------------------------------- History of present illness: Discussed the use of AI scribe software for clinical note transcription with the patient, who gave verbal consent to proceed.  History of Present Illness Grant Ramirez is a 38 year old male who presents with numbness and tingling in the left hand.  He has been experiencing numbness and tingling in the left hand, specifically affecting the fifth finger, since the end of July. The symptoms persisted for approximately five to six weeks. He describes the sensation as tingling and a feeling of barely being able to feel the area.  The symptoms were most intense when he initially made the appointment but have since improved significantly. Since September, the symptoms have not returned to the same severity, although he occasionally experiences mild tingling.  He recalls waking up with the symptoms one day and initially thought it might be related to his sleeping position. 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  No weakness in the hand, dropping objects, or recurrence of severe symptoms since the initial  episode.   Past Medical History:  Diagnosis Date   Asthma    no current inhaler use   Gout    none recent   Sebaceous cyst 03/25/2021   neck    Past Surgical History:  Procedure Laterality Date   EXCISION OF KELOID Right 09/20/2022   Procedure: EXCISION RIGHT FLANK SEBACEOUS CYST;  Surgeon: Lyndel Deward JINNY, MD;  Location: [facility];  Service: General;  Laterality: Right;   LESION REMOVAL Right 09/20/2022   Procedure: EXCISION RIGHT NECK SEBACEOUS CYST;  Surgeon: Lyndel Deward JINNY, MD;  Location: [facility];  Service: General;  Laterality: Right;   MASS EXCISION N/A 03/27/2021   Procedure: EXCISION OF POSTERIOR NECK SEBACEOUS CYST;  Surgeon: Lyndel Deward JINNY, MD;  Location: [facility];  Service: General;  Laterality: N/A;   NO PAST SURGERIES       Medications:  Outpatient Encounter Medications as of 05/01/2024  Medication Sig   REZDIFFRA 100 MG TABS Take 1 tablet by mouth daily.   colchicine  0.6 MG tablet Take 1 tablet (0.6 mg total) by mouth 2 (two) times daily. (Patient not taking: Reported on 05/01/2024)   No facility-administered encounter medications on file as of 05/01/2024.    Allergies: Allergies[1]  Family History: Family History  Problem Relation Age of Onset   Diabetes Father    Diabetes Paternal Grandmother     Social History: Social History[2] Social History   Social History Narrative   Are you right handed or left handed? Left handed   Are you currently employed ?  Yes   What is your  current occupation?   Do you live at home alone? Yes    Who lives with you?    What type of home do you live in: 1 story or 2 story? Lives in a one story home        Vital Signs:  BP (!) 149/87   Pulse 87   Ht 6' 1 (1.854 m)   Wt (!) 363 lb (164.7 kg)   SpO2 99%   BMI 47.89 kg/m    Neurological Exam: MENTAL STATUS including orientation to time, place, person, recent and remote memory, attention  span and concentration, language, and fund of knowledge is normal.  Speech is not dysarthric.  CRANIAL NERVES: II:  No visual field defects.     III-IV-VI: Pupils equal round and reactive to light.  Normal conjugate, extra-ocular eye movements in all directions of gaze.  No nystagmus.  No ptosis.   V:  Normal facial sensation.    VII:  Normal facial symmetry and movements.   VIII:  Normal hearing and vestibular function.   IX-X:  Normal palatal movement.   XI:  Normal shoulder shrug and head rotation.   XII:  Normal tongue strength and range of motion, no deviation or fasciculation.  MOTOR:  Motor strength is 5/5 throughout, including FDI and ADM.  No atrophy, fasciculations or abnormal movements.  No pronator drift.   MSRs:                                           Right        Left brachioradialis 2+  2+  biceps 2+  2+  triceps 2+  2+  patellar 2+  2+  ankle jerk 2+  2+  Hoffman no  no  plantar response down  down   SENSORY:  Normal and symmetric perception of light touch, pinprick, vibration, and temperature.  Tinel's sign is negative at the left elbow.  COORDINATION/GAIT: Normal finger-to- nose-finger.  Intact rapid alternating movements bilaterally.  Able to rise from a chair without using arms.  Gait narrow based and stable.    Thank you for allowing me to participate in patient's care.  If I can answer any additional questions, I would be pleased to do so.    Sincerely,    Handy Mcloud K. Chenelle Benning, DO    [1]  Allergies Allergen Reactions   Shellfish Allergy Anaphylaxis  [2]  Social History Tobacco Use   Smoking status: Every Day    Current packs/day: 0.00    Average packs/day: 0.3 packs/day for 15.0 years (3.8 ttl pk-yrs)    Types: Cigarettes    Start date: 10/08/2005    Last attempt to quit: 10/08/2020    Years since quitting: 3.5   Smokeless tobacco: Never  Vaping Use   Vaping status: Never Used  Substance Use Topics   Alcohol use: Yes    Alcohol/week: 1.0  standard drink of alcohol    Types: 1 Shots of liquor per week    Comment: occassionally   Drug use: Yes    Types: Marijuana    Comment: Twice a day   "

## 2024-05-01 NOTE — Patient Instructions (Signed)
Nerve testing of the left arm  ELECTROMYOGRAM AND NERVE CONDUCTION STUDIES (EMG/NCS) INSTRUCTIONS  How to Prepare The neurologist conducting the EMG will need to know if you have certain medical conditions. Tell the neurologist and other EMG lab personnel if you: Have a pacemaker or any other electrical medical device Take blood-thinning medications Have hemophilia, a blood-clotting disorder that causes prolonged bleeding Bathing Take a shower or bath shortly before your exam in order to remove oils from your skin. Don't apply lotions or creams before the exam.  What to Expect You'll likely be asked to change into a hospital gown for the procedure and lie down on an examination table. The following explanations can help you understand what will happen during the exam.  Electrodes. The neurologist or a technician places surface electrodes at various locations on your skin depending on where you're experiencing symptoms. Or the neurologist may insert needle electrodes at different sites depending on your symptoms.  Sensations. The electrodes will at times transmit a tiny electrical current that you may feel as a twinge or spasm. The needle electrode may cause discomfort or pain that usually ends shortly after the needle is removed. If you are concerned about discomfort or pain, you may want to talk to the neurologist about taking a short break during the exam.  Instructions. During the needle EMG, the neurologist will assess whether there is any spontaneous electrical activity when the muscle is at rest - activity that isn't present in healthy muscle tissue - and the degree of activity when you slightly contract the muscle.  He or she will give you instructions on resting and contracting a muscle at appropriate times. Depending on what muscles and nerves the neurologist is examining, he or she may ask you to change positions during the exam.  After your EMG You may experience some temporary, minor  bruising where the needle electrode was inserted into your muscle. This bruising should fade within several days. If it persists, contact your primary care doctor.   

## 2024-07-12 ENCOUNTER — Encounter: Payer: Self-pay | Admitting: Neurology
